# Patient Record
Sex: Male | Born: 1970 | Race: White | Hispanic: No | Marital: Married | Smoking: Current every day smoker
Health system: Southern US, Community
[De-identification: ages and names within clinical notes are randomized; demographics above are authoritative.]

## PROBLEM LIST (undated history)

## (undated) DIAGNOSIS — F17200 Nicotine dependence, unspecified, uncomplicated: Secondary | ICD-10-CM

## (undated) DIAGNOSIS — E785 Hyperlipidemia, unspecified: Secondary | ICD-10-CM

## (undated) DIAGNOSIS — M5386 Other specified dorsopathies, lumbar region: Secondary | ICD-10-CM

## (undated) DIAGNOSIS — F329 Major depressive disorder, single episode, unspecified: Secondary | ICD-10-CM

## (undated) DIAGNOSIS — K219 Gastro-esophageal reflux disease without esophagitis: Secondary | ICD-10-CM

## (undated) DIAGNOSIS — F419 Anxiety disorder, unspecified: Secondary | ICD-10-CM

## (undated) DIAGNOSIS — G709 Myoneural disorder, unspecified: Secondary | ICD-10-CM

## (undated) DIAGNOSIS — F32A Depression, unspecified: Secondary | ICD-10-CM

## (undated) HISTORY — DX: Nicotine dependence, unspecified, uncomplicated: F17.200

## (undated) HISTORY — PX: WISDOM TOOTH EXTRACTION: SHX21

## (undated) HISTORY — DX: Gastro-esophageal reflux disease without esophagitis: K21.9

## (undated) HISTORY — DX: Other specified dorsopathies, lumbar region: M53.86

## (undated) HISTORY — DX: Hyperlipidemia, unspecified: E78.5

---

## 2000-12-22 ENCOUNTER — Emergency Department (HOSPITAL_COMMUNITY): Admission: EM | Admit: 2000-12-22 | Discharge: 2000-12-22 | Payer: Self-pay

## 2002-01-19 ENCOUNTER — Emergency Department (HOSPITAL_COMMUNITY): Admission: EM | Admit: 2002-01-19 | Discharge: 2002-01-19 | Payer: Self-pay | Admitting: *Deleted

## 2006-05-22 ENCOUNTER — Emergency Department (HOSPITAL_COMMUNITY): Admission: EM | Admit: 2006-05-22 | Discharge: 2006-05-22 | Payer: Self-pay | Admitting: Emergency Medicine

## 2012-08-21 ENCOUNTER — Encounter (HOSPITAL_COMMUNITY): Payer: Self-pay | Admitting: Emergency Medicine

## 2012-08-21 ENCOUNTER — Emergency Department (HOSPITAL_COMMUNITY)
Admission: EM | Admit: 2012-08-21 | Discharge: 2012-08-21 | Disposition: A | Discharge status: Home / Self Care | Payer: Medicaid Other | Attending: Emergency Medicine | Admitting: Emergency Medicine

## 2012-08-21 DIAGNOSIS — M545 Low back pain, unspecified: Secondary | ICD-10-CM | POA: Insufficient documentation

## 2012-08-21 DIAGNOSIS — M5432 Sciatica, left side: Secondary | ICD-10-CM

## 2012-08-21 DIAGNOSIS — M79605 Pain in left leg: Secondary | ICD-10-CM

## 2012-08-21 DIAGNOSIS — M79609 Pain in unspecified limb: Secondary | ICD-10-CM | POA: Insufficient documentation

## 2012-08-21 DIAGNOSIS — M5430 Sciatica, unspecified side: Secondary | ICD-10-CM | POA: Insufficient documentation

## 2012-08-21 MED ORDER — PREDNISONE 20 MG PO TABS
60.0000 mg | ORAL_TABLET | Freq: Once | ORAL | Status: AC
  Administered 2012-08-21: 60 mg via ORAL
  Filled 2012-08-21: qty 3

## 2012-08-21 MED ORDER — OXYCODONE-ACETAMINOPHEN 5-325 MG PO TABS
2.0000 | ORAL_TABLET | Freq: Once | ORAL | Status: AC
  Administered 2012-08-21: 2 via ORAL
  Filled 2012-08-21 (×2): qty 1

## 2012-08-21 MED ORDER — PREDNISONE 10 MG PO TABS: 10.0000 mg | ORAL_TABLET | Freq: Every day | ORAL | Status: DC

## 2012-08-21 MED ORDER — HYDROCODONE-ACETAMINOPHEN 5-325 MG PO TABS: 1.0000 | ORAL_TABLET | Freq: Four times a day (QID) | ORAL | Status: DC | PRN

## 2012-08-21 MED ORDER — ONDANSETRON 4 MG PO TBDP
4.0000 mg | ORAL_TABLET | Freq: Once | ORAL | Status: AC
  Administered 2012-08-21: 4 mg via ORAL
  Filled 2012-08-21: qty 1

## 2012-08-21 MED ORDER — DIAZEPAM 5 MG PO TABS: 5.0000 mg | ORAL_TABLET | Freq: Two times a day (BID) | ORAL | Status: DC | PRN

## 2012-08-21 NOTE — ED Notes (Signed)
Lt leg pain for approx 4 days now and has lower back pain, unknown of any injury. Hurts to bend and sit down.

## 2012-08-21 NOTE — ED Notes (Signed)
MD at bedside. 

## 2012-08-21 NOTE — ED Provider Notes (Signed)
History     CSN: [id number]  Arrival date & time 08/21/12  0841   First MD Initiated Contact with Patient 08/21/12 0901      Chief Complaint  Patient presents with  . Leg Pain  . Back Pain    (Consider location/radiation/quality/duration/timing/severity/associated sxs/prior treatment) HPI  She presents to the emergency department with complaints of left lumbar back pain that radiates down the side of his leg all the way to his ankle. He says that he feels spasming in his low back his quad and his cath. New onset last week and he does not remember an injury to cause this. It hurts to bend and to sit. He is able to walk but slowly and with a slight limp. Does not have loss of bowel or bladder function, no history of IV drug use, no numbness, tingling or weakness. NAD vss      History reviewed. No pertinent past medical history.  History reviewed. No pertinent past surgical history.  No family history on file.  History  Substance Use Topics  . Smoking status: Not on file  . Smokeless tobacco: Not on file  . Alcohol Use: Not on file      Review of Systems  All other systems reviewed and are negative.    Allergies  Review of patient's allergies indicates not on file.  Home Medications  No current outpatient prescriptions on file.  BP 152/93  Pulse 81  Temp(Src) 97.6 F (36.4 C) (Oral)  Resp 16  SpO2 97%  Physical Exam  Nursing note and vitals reviewed. Constitutional: He appears well-developed and well-nourished. No distress.  HENT:  Head: Normocephalic and atraumatic.  Eyes: Pupils are equal, round, and reactive to light.  Neck: Normal range of motion. Neck supple.  Cardiovascular: Normal rate and regular rhythm.   Pulmonary/Chest: Effort normal.  Abdominal: Soft.  Musculoskeletal:       Back:   Equal strength to bilateral lower extremities. Neurosensory  function adequate to both legs. Skin color is normal. Skin is warm and moist. I see no step off  deformity, no bony tenderness. Pt is able to ambulate with limp due to pain. Pain is relieved when sitting in certain positions. ROM is decreased due to pain. No crepitus, laceration, effusion, swelling.  Pulses are normal   Neurological: He is alert.  Skin: Skin is warm and dry.    ED Course  Procedures (including critical care time)  Current facility-administered medications:ondansetron (ZOFRAN-ODT) disintegrating tablet 4 mg, 4 mg, Oral, Once, Cressida Milford G Kmya Placide, PA-C;  oxyCODONE-acetaminophen (PERCOCET/ROXICET) 5-325 MG per tablet 2 tablet, 2 tablet, Oral, Once, Laryn Venning G Arlen Legendre, PA-C;  predniSONE (DELTASONE) tablet 60 mg, 60 mg, Oral, Once, Dorthula Matas, PA-C Current outpatient prescriptions:diazepam (VALIUM) 5 MG tablet, Take 1 tablet (5 mg total) by mouth every 12 (twelve) hours as needed for anxiety., Disp: 12 tablet, Rfl: 0;  HYDROcodone-acetaminophen (NORCO/VICODIN) 5-325 MG per tablet, Take 1-2 tablets by mouth every 6 (six) hours as needed for pain., Disp: 12 tablet, Rfl: 0;  predniSONE (DELTASONE) 10 MG tablet, Take 1 tablet (10 mg total) by mouth daily., Disp: 21 tablet, Rfl: 0  Labs Reviewed - No data to display No results found.   1. Sciatica neuralgia, left       MDM  Patient with back pain. No neurological deficits. Patient is ambulatory. No warning symptoms of back pain including: loss of bowel or bladder control, night sweats, waking from sleep with back pain, unexplained fevers or weight loss,  h/o cancer, IVDU, recent trauma. No concern for cauda equina, epidural abscess, or other serious cause of back pain. Conservative measures such as rest, ice/heat and pain medicine and muscle relaxers indicated with PCP follow-up if no improvement with conservative management.           Dorthula Matas, PA-C 08/21/12 [phone]

## 2012-08-23 NOTE — ED Provider Notes (Signed)
Medical screening examination/treatment/procedure(s) were performed by non-physician practitioner and as supervising physician I was immediately available for consultation/collaboration.   Eleanor Dimichele E Emmarose Klinke, MD 08/23/12 2150 

## 2012-08-27 ENCOUNTER — Encounter (HOSPITAL_COMMUNITY): Payer: Self-pay

## 2012-08-27 ENCOUNTER — Emergency Department (HOSPITAL_COMMUNITY)
Admission: EM | Admit: 2012-08-27 | Discharge: 2012-08-27 | Disposition: A | Discharge status: Home / Self Care | Payer: Medicaid Other | Attending: Emergency Medicine | Admitting: Emergency Medicine

## 2012-08-27 DIAGNOSIS — M5432 Sciatica, left side: Secondary | ICD-10-CM

## 2012-08-27 DIAGNOSIS — M5430 Sciatica, unspecified side: Secondary | ICD-10-CM | POA: Insufficient documentation

## 2012-08-27 DIAGNOSIS — F17200 Nicotine dependence, unspecified, uncomplicated: Secondary | ICD-10-CM | POA: Insufficient documentation

## 2012-08-27 DIAGNOSIS — R269 Unspecified abnormalities of gait and mobility: Secondary | ICD-10-CM | POA: Insufficient documentation

## 2012-08-27 MED ORDER — ONDANSETRON 8 MG PO TBDP
8.0000 mg | ORAL_TABLET | Freq: Once | ORAL | Status: AC
  Administered 2012-08-27: 8 mg via ORAL
  Filled 2012-08-27: qty 1

## 2012-08-27 MED ORDER — HYDROCODONE-ACETAMINOPHEN 5-325 MG PO TABS: 1.0000 | ORAL_TABLET | Freq: Four times a day (QID) | ORAL | Status: DC | PRN

## 2012-08-27 MED ORDER — METHOCARBAMOL 500 MG PO TABS
500.0000 mg | ORAL_TABLET | Freq: Once | ORAL | Status: AC
  Administered 2012-08-27: 500 mg via ORAL
  Filled 2012-08-27: qty 1

## 2012-08-27 MED ORDER — METHOCARBAMOL 500 MG PO TABS: 500.0000 mg | ORAL_TABLET | Freq: Two times a day (BID) | ORAL | Status: DC | PRN

## 2012-08-27 MED ORDER — OXYCODONE-ACETAMINOPHEN 5-325 MG PO TABS
2.0000 | ORAL_TABLET | Freq: Once | ORAL | Status: AC
  Administered 2012-08-27: 2 via ORAL
  Filled 2012-08-27: qty 2

## 2012-08-27 NOTE — ED Notes (Signed)
Patient reports that he was seen for his back pain 6 days ago and was told to come back to the ED if the pain was no better. Patient states the pain is about the same and the meds that were given have not helped the pain.

## 2012-08-27 NOTE — ED Provider Notes (Signed)
History     CSN: [id number]  Arrival date & time 08/27/12  1330   First MD Initiated Contact with Patient 08/27/12 1400      Chief Complaint  Patient presents with  . reevaluation of back pain     (Consider location/radiation/quality/duration/timing/severity/associated sxs/prior treatment) HPI Comments: This is a 42 year old male who presents for reevaluation of his back pain. He was treated with a muscle relaxer, pain medication, and a steroid 1 week ago. He has had no improvement of his symptoms. He states the pain is along his low back with radiation down his right leg. It is sharp and burning. He was diagnosed with sciatica at his last visit. The pain has not changed since then. No fever, chills, vomiting, abdominal pain, dysuria, bowel or bladder incontinence. No history of CA.   The history is provided by the patient. No language interpreter was used.    History reviewed. No pertinent past medical history.  History reviewed. No pertinent past surgical history.  Family History  Problem Relation Age of Onset  . Cancer Sister     History  Substance Use Topics  . Smoking status: Current Every Day Smoker -- 1.00 packs/day    Types: Cigarettes  . Smokeless tobacco: Never Used  . Alcohol Use: Not on file     Comment: socially      Review of Systems  Constitutional: Negative for fever and chills.  Respiratory: Negative for shortness of breath.   Cardiovascular: Negative for chest pain.  Gastrointestinal: Negative for nausea, vomiting, abdominal pain and diarrhea.  Musculoskeletal: Positive for back pain.  All other systems reviewed and are negative.    Allergies  Review of patient's allergies indicates no known allergies.  Home Medications   Current Outpatient Rx  Name  Route  Sig  Dispense  Refill  . diazepam (VALIUM) 5 MG tablet   Oral   Take 1 tablet (5 mg total) by mouth every 12 (twelve) hours as needed for anxiety.   12 tablet   0   .  HYDROcodone-acetaminophen (NORCO/VICODIN) 5-325 MG per tablet   Oral   Take 1-2 tablets by mouth every 6 (six) hours as needed for pain.   12 tablet   0   . predniSONE (DELTASONE) 10 MG tablet   Oral   Take 1 tablet (10 mg total) by mouth daily.   21 tablet   0     6 tabs on day 1, 5 tabs on day 2, 4 tabs on day 3, ...     BP 169/80  Pulse 99  Temp(Src) 98 F (36.7 C) (Oral)  Resp 24  SpO2 100%  Physical Exam  Nursing note and vitals reviewed. Constitutional: He is oriented to person, place, and time. He appears well-developed and well-nourished. No distress.  HENT:  Head: Normocephalic and atraumatic.  Right Ear: External ear normal.  Left Ear: External ear normal.  Nose: Nose normal.  Eyes: Conjunctivae are normal.  Neck: Normal range of motion. No tracheal deviation present.  Cardiovascular: Normal rate, regular rhythm and normal heart sounds.   Pulmonary/Chest: Effort normal and breath sounds normal. No stridor.  Abdominal: Soft. He exhibits no distension. There is no tenderness.  Musculoskeletal: Normal range of motion.       Lumbar back: He exhibits tenderness and pain.  ttp over sciatic notch No step offs, deformity, or bony tenderness  Neurological: He is alert and oriented to person, place, and time. Gait (antalgic) abnormal.  Skin: Skin is  warm and dry. He is not diaphoretic.  Psychiatric: He has a normal mood and affect. His behavior is normal.    ED Course  Procedures (including critical care time)  Labs Reviewed - No data to display No results found.   1. Sciatica neuralgia, left       MDM  Patient presents for follow up of low back pain. Symptoms unchanged after 1 week of conservative management. Given a referral to ortho for better management of pain. No concern for cauda equina or spinal abscess. Symptoms consistent with sciatica. Return precautions given. Vital signs stable for discharge. Patient / Family / Caregiver informed of clinical  course, understand medical decision-making process, and agree with plan.         Mora Bellman, PA-C 08/29/12 1017

## 2012-08-30 NOTE — ED Provider Notes (Signed)
Medical screening examination/treatment/procedure(s) were performed by non-physician practitioner and as supervising physician I was immediately available for consultation/collaboration.    Eryc Bodey R Ala Kratz, MD 08/30/12 0423 

## 2012-09-13 ENCOUNTER — Encounter (HOSPITAL_COMMUNITY): Payer: Self-pay | Admitting: *Deleted

## 2012-09-13 ENCOUNTER — Emergency Department (INDEPENDENT_AMBULATORY_CARE_PROVIDER_SITE_OTHER): Payer: Medicaid Other

## 2012-09-13 ENCOUNTER — Emergency Department (HOSPITAL_COMMUNITY)
Admission: EM | Admit: 2012-09-13 | Discharge: 2012-09-13 | Disposition: A | Discharge status: Home / Self Care | Payer: Medicaid Other | Source: Home / Self Care | Attending: Emergency Medicine | Admitting: Emergency Medicine

## 2012-09-13 DIAGNOSIS — M5430 Sciatica, unspecified side: Secondary | ICD-10-CM

## 2012-09-13 DIAGNOSIS — M5432 Sciatica, left side: Secondary | ICD-10-CM

## 2012-09-13 MED ORDER — KETOROLAC TROMETHAMINE 60 MG/2ML IM SOLN
60.0000 mg | Freq: Once | INTRAMUSCULAR | Status: AC
  Administered 2012-09-13: 60 mg via INTRAMUSCULAR

## 2012-09-13 MED ORDER — METHYLPREDNISOLONE ACETATE 40 MG/ML IJ SUSP
80.0000 mg | Freq: Once | INTRAMUSCULAR | Status: AC
  Administered 2012-09-13: 80 mg via INTRAMUSCULAR

## 2012-09-13 MED ORDER — METHYLPREDNISOLONE ACETATE 80 MG/ML IJ SUSP
INTRAMUSCULAR | Status: AC
  Filled 2012-09-13: qty 1

## 2012-09-13 MED ORDER — KETOROLAC TROMETHAMINE 60 MG/2ML IM SOLN
INTRAMUSCULAR | Status: AC
  Filled 2012-09-13: qty 2

## 2012-09-13 MED ORDER — OXYCODONE-ACETAMINOPHEN 5-325 MG PO TABS: 1.0000 | ORAL_TABLET | ORAL | Status: DC | PRN

## 2012-09-13 MED ORDER — TRIAMCINOLONE ACETONIDE 40 MG/ML IJ SUSP (RADIOLOGY): 40.0000 mg | Freq: Once | INTRAMUSCULAR | Status: DC

## 2012-09-13 MED ORDER — PREDNISONE 10 MG PO TABS: 20.0000 mg | ORAL_TABLET | Freq: Every day | ORAL | Status: DC

## 2012-09-13 NOTE — ED Provider Notes (Signed)
History     CSN: [id number]  Arrival date & time 09/13/12  1902   First MD Initiated Contact with Patient 09/13/12 2012      Chief Complaint  Patient presents with  . Back Pain    (Consider location/radiation/quality/duration/timing/severity/associated sxs/prior treatment) Patient is a 42 y.o. male presenting with back pain. The history is provided by the patient.  Back Pain Location:  Lumbar spine Quality:  Stabbing Radiates to:  L posterior upper leg Pain severity:  Moderate Pain is:  Same all the time Onset quality:  Gradual Timing:  Constant Progression:  Unchanged Chronicity:  New Context: not falling and not recent injury   Relieved by:  Nothing Worsened by:  Movement Ineffective treatments:  NSAIDs and OTC medications Associated symptoms: leg pain and tingling   Associated symptoms: no bladder incontinence, no bowel incontinence, no fever, no perianal numbness and no weakness     History reviewed. No pertinent past medical history.  History reviewed. No pertinent past surgical history.  Family History  Problem Relation Age of Onset  . Cancer Sister     History  Substance Use Topics  . Smoking status: Current Every Day Smoker -- 1.00 packs/day    Types: Cigarettes  . Smokeless tobacco: Never Used  . Alcohol Use: Not on file     Comment: socially      Review of Systems  Constitutional: Negative.  Negative for fever.  Gastrointestinal: Negative for bowel incontinence.  Genitourinary: Negative for bladder incontinence.  Musculoskeletal: Positive for back pain.  Neurological: Positive for tingling. Negative for weakness.  All other systems reviewed and are negative.    Allergies  Review of patient's allergies indicates no known allergies.  Home Medications   Current Outpatient Rx  Name  Route  Sig  Dispense  Refill  . diazepam (VALIUM) 5 MG tablet   Oral   Take 1 tablet (5 mg total) by mouth every 12 (twelve) hours as needed for anxiety.  12 tablet   0   . HYDROcodone-acetaminophen (NORCO) 5-325 MG per tablet   Oral   Take 1 tablet by mouth every 6 (six) hours as needed for pain.   6 tablet   0   . HYDROcodone-acetaminophen (NORCO/VICODIN) 5-325 MG per tablet   Oral   Take 1-2 tablets by mouth every 6 (six) hours as needed for pain.   12 tablet   0   . methocarbamol (ROBAXIN) 500 MG tablet   Oral   Take 1 tablet (500 mg total) by mouth 2 (two) times daily as needed.   20 tablet   0   . oxyCODONE-acetaminophen (PERCOCET/ROXICET) 5-325 MG per tablet   Oral   Take 1 tablet by mouth every 4 (four) hours as needed for pain.   20 tablet   0   . predniSONE (DELTASONE) 10 MG tablet   Oral   Take 1 tablet (10 mg total) by mouth daily.   21 tablet   0     6 tabs on day 1, 5 tabs on day 2, 4 tabs on day 3, ...   . predniSONE (DELTASONE) 10 MG tablet   Oral   Take 2 tablets (20 mg total) by mouth daily.   15 tablet   0     BP 160/81  Pulse 85  Temp(Src) 98 F (36.7 C) (Oral)  Resp 16  SpO2 98%  Physical Exam  Vitals reviewed. Constitutional: He is oriented to person, place, and time. He appears well-developed and well-nourished.  HENT:  Head: Normocephalic and atraumatic.  Eyes: Pupils are equal, round, and reactive to light.  Neck: Normal range of motion. Neck supple.  Cardiovascular: Normal rate and regular rhythm.   Pulmonary/Chest: Effort normal and breath sounds normal.  Abdominal: Soft. Bowel sounds are normal.  Musculoskeletal:  LOW BACK SHOWED NO SWELLING OR DEFORMITY NO ERYTHEMA OR TENDERNESS ALONG SPINE MILD PARALUMBAR MUSCLE TENEDERNESS   Neurological: He is alert and oriented to person, place, and time. He has normal reflexes. He exhibits normal muscle tone.  Skin: Skin is warm and dry.  Psychiatric: He has a normal mood and affect. His behavior is normal. Judgment and thought content normal.    ED Course  Procedures (including critical care time)  Labs Reviewed - No data to  display Dg Lumbar Spine Complete  09/13/2012  *RADIOLOGY REPORT*  Clinical Data: Lower back pain on the left side for 3 weeks.  Pain radiating down the left leg.  LUMBAR SPINE - COMPLETE 4+ VIEW  Comparison: None.  Findings: Five lumbar type vertebral bodies are present.  Vertebral body height is preserved.  The L4-L5 disc space narrowing is present.  Grade 1 retrolisthesis of L4 on L5 is present associated with disc space collapse.  This measures about 4 mm.  There is no fracture.  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 Findings suggest L4-L5 disc herniation which could be further evaluated on nonemergent MRI.   Original Report Authenticated By: Andreas Newport, M.D.      1. Sciatica neuralgia, left       MDM          Jani Files, MD 09/13/12 2104

## 2012-09-13 NOTE — ED Notes (Signed)
Pt  Reports  Low  Back  Pain  With  Pain  Down l  Leg      Symptoms  X  2  Weeks       denys  Any injury   Pain is  Worse  When he  Sits  Down         denys  Any  Urinary  Symptoms       Symptoms  Not  releived  By otc  meds          Walks  With slow  Gait  Appears  In pain

## 2012-09-18 ENCOUNTER — Ambulatory Visit: Payer: Self-pay | Admitting: Family Medicine

## 2014-03-06 ENCOUNTER — Encounter: Payer: Self-pay | Admitting: Physician Assistant

## 2014-03-06 ENCOUNTER — Ambulatory Visit (INDEPENDENT_AMBULATORY_CARE_PROVIDER_SITE_OTHER): Payer: Medicaid Other | Admitting: Physician Assistant

## 2014-03-06 VITALS — BP 142/82 | HR 76 | Temp 98.1°F | Resp 20 | Ht 71.5 in | Wt 236.0 lb

## 2014-03-06 DIAGNOSIS — Z23 Encounter for immunization: Secondary | ICD-10-CM

## 2014-03-06 DIAGNOSIS — Z720 Tobacco use: Secondary | ICD-10-CM | POA: Diagnosis not present

## 2014-03-06 DIAGNOSIS — F17200 Nicotine dependence, unspecified, uncomplicated: Secondary | ICD-10-CM

## 2014-03-06 DIAGNOSIS — Z801 Family history of malignant neoplasm of trachea, bronchus and lung: Secondary | ICD-10-CM | POA: Diagnosis not present

## 2014-03-06 DIAGNOSIS — Z0000 Encounter for general adult medical examination without abnormal findings: Secondary | ICD-10-CM | POA: Diagnosis not present

## 2014-03-06 DIAGNOSIS — M539 Dorsopathy, unspecified: Secondary | ICD-10-CM | POA: Diagnosis not present

## 2014-03-06 DIAGNOSIS — M5386 Other specified dorsopathies, lumbar region: Secondary | ICD-10-CM

## 2014-03-06 LAB — COMPLETE METABOLIC PANEL WITH GFR
ALBUMIN: 4.5 g/dL (ref 3.5–5.2)
ALT: 28 U/L (ref 0–53)
AST: 24 U/L (ref 0–37)
Alkaline Phosphatase: 93 U/L (ref 39–117)
BUN: 9 mg/dL (ref 6–23)
CALCIUM: 9.4 mg/dL (ref 8.4–10.5)
CHLORIDE: 105 meq/L (ref 96–112)
CO2: 26 meq/L (ref 19–32)
CREATININE: 1.11 mg/dL (ref 0.50–1.35)
GFR, Est Non African American: 80 mL/min
Glucose, Bld: 83 mg/dL (ref 70–99)
Potassium: 4.9 mEq/L (ref 3.5–5.3)
Sodium: 138 mEq/L (ref 135–145)
Total Bilirubin: 0.6 mg/dL (ref 0.2–1.2)
Total Protein: 7.4 g/dL (ref 6.0–8.3)

## 2014-03-06 LAB — CBC WITH DIFFERENTIAL/PLATELET
Basophils Absolute: 0.1 10*3/uL (ref 0.0–0.1)
Basophils Relative: 1 % (ref 0–1)
EOS PCT: 2 % (ref 0–5)
Eosinophils Absolute: 0.1 10*3/uL (ref 0.0–0.7)
HEMATOCRIT: 46.4 % (ref 39.0–52.0)
HEMOGLOBIN: 16.4 g/dL (ref 13.0–17.0)
LYMPHS ABS: 2 10*3/uL (ref 0.7–4.0)
LYMPHS PCT: 32 % (ref 12–46)
MCH: 32.6 pg (ref 26.0–34.0)
MCHC: 35.3 g/dL (ref 30.0–36.0)
MCV: 92.2 fL (ref 78.0–100.0)
MONO ABS: 0.4 10*3/uL (ref 0.1–1.0)
MONOS PCT: 7 % (ref 3–12)
Neutro Abs: 3.7 10*3/uL (ref 1.7–7.7)
Neutrophils Relative %: 58 % (ref 43–77)
Platelets: 231 10*3/uL (ref 150–400)
RBC: 5.03 MIL/uL (ref 4.22–5.81)
RDW: 13.4 % (ref 11.5–15.5)
WBC: 6.3 10*3/uL (ref 4.0–10.5)

## 2014-03-06 LAB — LIPID PANEL
CHOLESTEROL: 216 mg/dL — AB (ref 0–200)
HDL: 35 mg/dL — ABNORMAL LOW (ref >39)
LDL CALC: 124 mg/dL — AB (ref 0–99)
TRIGLYCERIDES: 283 mg/dL — AB (ref <150)
Total CHOL/HDL Ratio: 6.2 Ratio
VLDL: 57 mg/dL — ABNORMAL HIGH (ref 0–40)

## 2014-03-06 LAB — TSH: TSH: 1.547 u[IU]/mL (ref 0.350–4.500)

## 2014-03-06 MED ORDER — BUPROPION HCL ER (SR) 150 MG PO TB12: ORAL_TABLET | ORAL | Status: DC

## 2014-03-06 NOTE — Progress Notes (Signed)
Patient ID: Rick Kim MRN: [id number], DOB: 1970/07/09 43 y.o. Date of Encounter: 03/06/2014, 12:48 PM    Chief Complaint: Physical (CPE)  HPI: 43 y.o. y/o male here for CPE.   His last office visit here was 07/20/2012. At that time he had a complete physical exam with Dr. Dennard Schaumann. He says that prior to that he come here off and on for many many years.  He has no specific complaints or concerns today.   Review of Systems: Consitutional: No fever, chills, fatigue, night sweats, lymphadenopathy, or weight changes. Eyes: No visual changes, eye redness, or discharge. ENT/Mouth: Ears: No otalgia, tinnitus, hearing loss, discharge. Nose: No congestion, rhinorrhea, sinus pain, or epistaxis. Throat: No sore throat, post nasal drip, or teeth pain. Cardiovascular: No CP, palpitations, diaphoresis, DOE, edema, orthopnea, PND. Respiratory: No cough, hemoptysis, SOB, or wheezing. Gastrointestinal: No anorexia, dysphagia, reflux, pain, nausea, vomiting, hematemesis, diarrhea, constipation, BRBPR, or melena. Genitourinary: No dysuria, frequency, urgency, hematuria, incontinence, nocturia, decreased urinary stream, discharge, impotence, or testicular pain/masses. Musculoskeletal: No decreased ROM, myalgias, stiffness, joint swelling, or weakness. Skin: No rash, erythema, lesion changes, pain, warmth, jaundice, or pruritis. Neurological: No headache, dizziness, syncope, seizures, tremors, memory loss, coordination problems, or paresthesias. Psychological: No anxiety, depression, hallucinations, SI/HI. Endocrine: No fatigue, polydipsia, polyphagia, polyuria, or known diabetes. All other systems were reviewed and are otherwise negative.  Past Medical History  Diagnosis Date  . Smoker   . Disorder of lumbar spine   He says  that he has had problems with discs in his lumbar spine. Says he has had injections done to the area --at [facility]-- in the past.  History reviewed. No  pertinent past surgical history.  Home Meds:  None  Allergies: No Known Allergies  History   Social History  . Marital Status: Divorced    Spouse Name: N/A    Number of Children: N/A  . Years of Education: N/A   Occupational History  . Not on file.   Social History Main Topics  . Smoking status: Current Every Day Smoker -- 1.00 packs/day    Types: Cigarettes  . Smokeless tobacco: Never Used  . Alcohol Use: Not on file     Comment: socially  . Drug Use: No  . Sexual Activity: Not on file   Other Topics Concern  . Not on file   Social History Narrative   Entered 2015:   Married.   4 kids -- Ages 15 y/o, 47 y/o, Twins 35 y/o   2 older kids from different marriage.    Twins from new marriage --this wife had never had kids until these twins.   Works on [employer].   He walks around neighborhood routinely and rides bike.          Family History  Problem Relation Age of Onset  . Cancer Sister 30    Lung Cancer  . Alcohol abuse Father     Physical Exam: Blood pressure 142/82, pulse 76, temperature 98.1 F (36.7 C), temperature source Oral, resp. rate 20, height 5' 11.5" (1.816 m), weight 236 lb (107.049 kg).  General: Well developed, well nourished, WM. Appears in no acute distress. HEENT: Normocephalic, atraumatic. Conjunctiva pink, sclera non-icteric. Pupils 2 mm constricting to 1 mm, round, regular, and equally reactive to light and accomodation. EOMI. Internal auditory canal clear. TMs with good cone of light and without pathology. Nasal mucosa pink. Nares are without discharge. No sinus tenderness. Oral mucosa pink. Dentition--He has upper Dentures in. Pharynx  without exudate.   Neck: Supple. Trachea midline. No thyromegaly. Full ROM. No lymphadenopathy. Lungs: Clear to auscultation bilaterally without wheezes, rales, or rhonchi. Breathing is of normal effort and unlabored. Cardiovascular: RRR with S1 S2. No murmurs, rubs, or gallops. Distal pulses 2+ symmetrically. No  carotid or abdominal bruits. Abdomen: Soft, non-tender, non-distended with normoactive bowel sounds. No hepatosplenomegaly or masses. No rebound/guarding. No CVA tenderness. No hernias.  Musculoskeletal: Full range of motion and 5/5 strength throughout. Without swelling, atrophy, tenderness, crepitus, or warmth. Extremities without clubbing, cyanosis, or edema. Calves supple. Skin: He has lots of tattoos-- on arms, chest, back   Warm and moist without erythema, ecchymosis, wounds, or rash. Neuro: A+Ox3. CN II-XII grossly intact. Moves all extremities spontaneously. Full sensation throughout. Normal gait. DTR 2+ throughout upper and lower extremities. Finger to nose intact. Psych:  Responds to questions appropriately with a normal affect.   Assessment/Plan:  43 y.o. y/o white male here for CPE  -1. Visit for preventive health examination  A. Screening Labs:  - CBC with Differential - COMPLETE METABOLIC PANEL WITH GFR - Lipid panel - TSH  B. Screening For Prostate Cancer:  Not indicated until age 58  C. Screening For Colorectal Cancer:   Not indicated until age 34  D. Immunizations: Flu--- patient is agreeable to receive influenza vaccine here today. Tetanus--he reports he has had no tetanus vaccine in the past 10 year's. He is agreeable to receive this today. Pneumococcal--- given that he is a smoker he does need pneumonia vaccine.  WILL NEED TO GIVE THIS AT HIS NEXT OV Zostavax----not indicated until age 61.   - DG Chest 2 View; Future  2. Smoker Given that he is a smoker and his sister had lung cancer Will obtain chest x-ray. He states that his sister who had lung cancer actually did not even smoke. He is agreeable to obtain x-ray. - DG Chest 2 View; Future  At his office visit here 07/2012 Chantix was prescribed for smoking cessation. Patient reports that he took the medication for about 2 weeks but then it made him feel weird and actually even scared him so he quit taking it.  However he says that he does want to quit smoking and would like to use any other treatment to help him with this. Today I have given him a handout with Tips for Cessation.  Will also prescribe Wellbutrin. If he develops any adverse effects then he needs to stop the medication immediately and notify us. - buPROPion (WELLBUTRIN SR) 150 MG 12 hr tablet; Take one daily for 5 days then increase to twice a day  Dispense: 60 tablet; Refill: 4  3. 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 Family history of lung cancer - DG Chest 2 View; Future  5. Multiple Tattoos  5. Need for prophylactic vaccination and inoculation against influenza - Flu Vaccine QUAD 36+ mos PF IM (Fluarix Quad PF)  6. Need for prophylactic vaccination with combined diphtheria-tetanus-pertussis (DTP) vaccine - Tdap vaccine greater than or equal to 7yo IM   Signed:   [address] [place], [place]  03/06/2014 12:48 PM

## 2014-05-17 HISTORY — PX: MULTIPLE TOOTH EXTRACTIONS: SHX2053

## 2016-04-22 ENCOUNTER — Ambulatory Visit
Admission: RE | Admit: 2016-04-22 | Discharge: 2016-04-22 | Disposition: A | Discharge status: Home / Self Care | Payer: Medicaid Other | Source: Ambulatory Visit | Attending: Physician Assistant | Admitting: Physician Assistant

## 2016-04-22 ENCOUNTER — Encounter: Payer: Self-pay | Admitting: Physician Assistant

## 2016-04-22 ENCOUNTER — Ambulatory Visit (INDEPENDENT_AMBULATORY_CARE_PROVIDER_SITE_OTHER): Payer: Medicaid Other | Admitting: Physician Assistant

## 2016-04-22 VITALS — BP 150/92 | HR 74 | Temp 97.4°F | Resp 18 | Wt 249.0 lb

## 2016-04-22 DIAGNOSIS — R202 Paresthesia of skin: Secondary | ICD-10-CM | POA: Diagnosis not present

## 2016-04-22 DIAGNOSIS — F17200 Nicotine dependence, unspecified, uncomplicated: Secondary | ICD-10-CM

## 2016-04-22 DIAGNOSIS — G4700 Insomnia, unspecified: Secondary | ICD-10-CM | POA: Diagnosis not present

## 2016-04-22 DIAGNOSIS — Z801 Family history of malignant neoplasm of trachea, bronchus and lung: Secondary | ICD-10-CM | POA: Diagnosis not present

## 2016-04-22 DIAGNOSIS — Z0000 Encounter for general adult medical examination without abnormal findings: Secondary | ICD-10-CM

## 2016-04-22 DIAGNOSIS — M792 Neuralgia and neuritis, unspecified: Secondary | ICD-10-CM

## 2016-04-22 DIAGNOSIS — Z23 Encounter for immunization: Secondary | ICD-10-CM | POA: Diagnosis not present

## 2016-04-22 DIAGNOSIS — G5690 Unspecified mononeuropathy of unspecified upper limb: Secondary | ICD-10-CM

## 2016-04-22 LAB — CBC WITH DIFFERENTIAL/PLATELET
BASOS ABS: 57 {cells}/uL (ref 0–200)
Basophils Relative: 1 %
EOS ABS: 114 {cells}/uL (ref 15–500)
Eosinophils Relative: 2 %
HEMATOCRIT: 47.8 % (ref 38.5–50.0)
HEMOGLOBIN: 16.2 g/dL (ref 13.0–17.0)
LYMPHS ABS: 1938 {cells}/uL (ref 850–3900)
Lymphocytes Relative: 34 %
MCH: 31.3 pg (ref 27.0–33.0)
MCHC: 33.9 g/dL (ref 32.0–36.0)
MCV: 92.5 fL (ref 80.0–100.0)
MONO ABS: 456 {cells}/uL (ref 200–950)
MPV: 10.6 fL (ref 7.5–12.5)
Monocytes Relative: 8 %
NEUTROS ABS: 3135 {cells}/uL (ref 1500–7800)
Neutrophils Relative %: 55 %
Platelets: 250 10*3/uL (ref 140–400)
RBC: 5.17 MIL/uL (ref 4.20–5.80)
RDW: 12.9 % (ref 11.0–15.0)
WBC: 5.7 10*3/uL (ref 3.8–10.8)

## 2016-04-22 LAB — COMPLETE METABOLIC PANEL WITH GFR
ALBUMIN: 4.5 g/dL (ref 3.6–5.1)
ALK PHOS: 81 U/L (ref 40–115)
ALT: 26 U/L (ref 9–46)
AST: 21 U/L (ref 10–40)
BILIRUBIN TOTAL: 0.4 mg/dL (ref 0.2–1.2)
BUN: 11 mg/dL (ref 7–25)
CO2: 27 mmol/L (ref 20–31)
CREATININE: 1.26 mg/dL (ref 0.60–1.35)
Calcium: 9.7 mg/dL (ref 8.6–10.3)
Chloride: 103 mmol/L (ref 98–110)
GFR, EST AFRICAN AMERICAN: 79 mL/min (ref >=60)
GFR, EST NON AFRICAN AMERICAN: 68 mL/min (ref >=60)
Glucose, Bld: 76 mg/dL (ref 70–99)
Potassium: 4.6 mmol/L (ref 3.5–5.3)
Sodium: 138 mmol/L (ref 135–146)
TOTAL PROTEIN: 7.2 g/dL (ref 6.1–8.1)

## 2016-04-22 LAB — TSH: TSH: 2.29 mIU/L (ref 0.40–4.50)

## 2016-04-22 MED ORDER — ZOLPIDEM TARTRATE 10 MG PO TABS: 10.0000 mg | ORAL_TABLET | Freq: Every evening | ORAL | 1 refills | Status: DC | PRN

## 2016-04-22 NOTE — Progress Notes (Signed)
Patient ID: Rick Kim MRN: [id number], DOB: [birth year] 45 y.o. Date of Encounter: 04/22/2016, 9:26 AM    Chief Complaint: Physical (CPE)  HPI: 45 y.o. y/o male here for CPE.   He had OV 07/2012 for complete physical exam with Dr. Dennard Schaumann. He had CPE with me 03/06/2014. He said that prior to that he come here off and on for many many years.  Today he does have a couple of issues to report/discuss.  States that he has been having problems with pain and paresthesias down his arms. He says that he feels it all the way down the entire arm. Says that he feels a throbbing discomfort and also a feeling as if they have "fallen asleep ". Says that if it happens during the daytime then he is able to shake out his arms and move them around. Says that at night he will wake up and they just feel sore. Says that sometimes when he is "gripping the steering wheel and also when he is drawing or painting --at these times his entire arms bilaterally will feel numb. Asked if he has felt any neck pain. 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 Has not been dropping any tools etc.  He also says that he is getting "no sleep"--says that his "mind won't shut off". So, he feels like he has absolutely no energy and feels exhausted. Says that he will get in bed but then just stares at the wall etc. and can't sleep. Says that recently he has just been falling asleep in the recliner watching TV. Even so he still doesn't fall asleep until around 1 or 2 in the morning. Then he says that if anything at all wakes him up, then he can't go back to sleep. "If the dog barks then it's all over."  Asked how often this happens but he says that usually will have to wake up to go to the bathroom or something and then he never can get back to sleep. He does not take any naps during the day.  Also discussed that at his physical 02/2014 I had ordered a chest x-ray but he did not have it done. Says he was unable to get a ride at that time but that he does want to get that chest x-ray. Noted that I had prescribed Wellbutrin for smoking cessation and he says he doesn't think he ever used it. Is currently smoking 1 pack per day.  No other complaints or concerns.   Review of Systems: Consitutional: No fever, chills, fatigue, night sweats, lymphadenopathy, or weight changes. Eyes: No visual changes, eye redness, or discharge. ENT/Mouth: Ears: No otalgia, tinnitus, hearing loss, discharge. Nose: No congestion, rhinorrhea, sinus pain, or epistaxis. Throat: No sore throat, post nasal drip, or teeth pain. Cardiovascular: No CP, palpitations, diaphoresis, DOE, edema, orthopnea, PND. Respiratory: No cough, hemoptysis, SOB, or wheezing. Gastrointestinal: No anorexia, dysphagia, reflux, pain, nausea, vomiting, hematemesis, diarrhea, constipation, BRBPR, or melena. Genitourinary: No dysuria, frequency, urgency, hematuria, incontinence, nocturia, decreased urinary stream, discharge, impotence, or testicular pain/masses. Musculoskeletal: No decreased ROM, myalgias, stiffness, joint  swelling, or weakness. Skin: No rash, erythema, lesion changes, pain, warmth, jaundice, or pruritis. Neurological: No headache, dizziness, syncope, seizures, tremors, memory loss, coordination problems, or paresthesias. Psychological: No anxiety, depression, hallucinations, SI/HI. Endocrine: No fatigue, polydipsia, polyphagia, polyuria, or known diabetes. All other systems were reviewed and are otherwise negative.  Past Medical History:  Diagnosis Date  . Disorder of lumbar spine   . 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 Says he has had injections done to the area --at [facility]-- in the past.  No past surgical history on file.  Home Meds:  None  Allergies: No Known Allergies  Social History   Social History  . Marital status: Divorced    Spouse name: N/A  . Number of children: N/A  . Years of education: N/A   Occupational History  . Not on file.   Social History Main Topics  . Smoking status: Current Every Day Smoker    Packs/day: 1.00    Types: Cigarettes  . Smokeless tobacco: Never Used  . Alcohol use Not on file     Comment: socially  . Drug use: No  . Sexual activity: Not on file   Other Topics Concern  . Not on file   Social History Narrative   Entered 2015:   Married.   4 kids -- Ages 23 y/o, 8 y/o, Twins 4 y/o   2 older kids from different marriage.    Twins from new marriage --this wife had never had kids until these twins.   Works on [employer].   He walks around neighborhood routinely and rides bike.          Family History  Problem Relation Age of Onset  . Cancer Sister 46    Lung Cancer  . Alcohol abuse Father     Physical Exam: Blood pressure (!) 150/92, pulse 74, temperature 97.4 F (36.3 C), temperature source Oral, resp. rate 18, weight 249 lb (112.9 kg), SpO2 97 %.  General: Well developed, well nourished, WM. Appears in no acute distress. HEENT: Normocephalic, atraumatic. Conjunctiva pink, sclera non-icteric. Pupils 2 mm constricting to 1 mm, round, regular, and equally reactive to light and accomodation. EOMI. Internal auditory canal clear. TMs with good cone of light and without pathology. Nasal mucosa pink. Nares are without discharge. No sinus tenderness. Oral mucosa pink. Dentition--He has upper Dentures in. Pharynx without exudate.   Neck: Supple. Trachea midline. No thyromegaly.  No lymphadenopathy. Lungs: Clear to auscultation bilaterally without wheezes, rales, or rhonchi. Breathing is of normal effort and  unlabored. Cardiovascular: RRR with S1 S2. No murmurs, rubs, or gallops. Distal pulses 2+ symmetrically. No carotid or abdominal bruits. Abdomen: Soft, non-tender, non-distended with normoactive bowel sounds. No hepatosplenomegaly or masses. No rebound/guarding. No CVA tenderness. No hernias.  Musculoskeletal: Full range of motion and 5/5 strength throughout. Without swelling, atrophy, tenderness, crepitus, or warmth.  He does have tenderness with palpation along both sides of his neck and down towards the scapula covering the entire area of the trapezius etc. He has pretty good range of motion of his neck. So had him perform range of motion of the shoulders and he has full range of motion but when he abducts does say that that causes pain up in the tops of the shoulders. He has strong 5/5 grip strength and upper extremity strength bilaterally. Skin: He has lots of tattoos-- on arms, chest, back   Warm and moist without erythema,  ecchymosis, wounds, or rash. Neuro: A+Ox3. CN II-XII grossly intact. Moves all extremities spontaneously. Full sensation throughout. Normal gait. DTR 2+ throughout upper and lower extremities.  Psych:  Responds to questions appropriately with a normal affect.   Assessment/Plan:  45 y.o. y/o white male here for CPE  -1. Visit for preventive health examination  A. Screening Labs: He is not fasting today. Reviewed his lipid panel from 02/2014 was okay so we can just skip rechecking lipid panel today and go ahead and check other labs while he is here. - CBC with Differential - COMPLETE METABOLIC PANEL WITH GFR - TSH  B. Screening For Prostate Cancer:  Not indicated until age 66  C. Screening For Colorectal Cancer:   Not indicated until age 57  D. Immunizations: Flu--- patient is agreeable to receive influenza vaccine here today. Tetanus-- Tdap--Given here 03/06/2014 Pneumococcal--- given that he is a smoker he does need pneumonia vaccine. --Pneumovax 23---Given  here 04/22/2016.   -- Further Pneumonia Vaccine not due until age 73 Zostavax----not indicated until age 37.    2. Smoker Given that he is a smoker and his sister had lung cancer Will obtain chest x-ray. He states that his sister who had lung cancer actually did not even smoke. He is agreeable to obtain x-ray. - DG Chest 2 View; Future  At his office visit here 07/2012 Chantix was prescribed for smoking cessation. Patient reports that he took the medication for about 2 weeks but then it made him feel weird and actually even scared him so he quit taking it. However he says that he does want to quit smoking and would like to use any other treatment to help him with this. At CPE 02/2014--- I have given him a handout with Tips for Cessation.  At CPE 02/2014--- I also prescribed Wellbutrin. However he never took this. 04/22/2016: Will rediscuss smoking cessation at follow-up visit.  3. Family history of lung cancer He states that his sister who had lung cancer actually did not even smoke.  - DG Chest 2 View; Future   Insomnia, unspecified type He mentions his "mind racing"---he may possibly have a component of generalized anxiety disorder--- but when I talked to him more about these symptoms-- during the daytime it doesn't seem to be that significant----ultimately he may need to be on an SSRI----- but I think first step we need to just get him to where he can get some adequate sleep and then see how he is feeling when he is not sleep deprived and exhausted. He states that he has never used any medicines for sleep --  has not used Ambien in the past. He is to take the Ambien about 30 minutes before planning to go to sleep and make sure he has at least 8 hours to sleep. If this causes any adverse effects, he is to call me.  Otherwise, will have him schedule follow-up visit in one week and will follow this up at that time. - zolpidem (AMBIEN) 10 MG tablet; Take 1 tablet (10 mg total) by mouth at bedtime  as needed for sleep.  Dispense: 15 tablet; Refill: 1  5. Paresthesia of arm Will obtain x-ray cervical spine and follow-up with him when I get this result. As well we'll schedule follow-up visit with me in one week. - DG Cervical Spine Complete; Future  6. Neuropathic pain, arm Will obtain x-ray cervical spine and follow-up with him when I get this result. As well we'll schedule follow-up visit with me  in one week. - DG Cervical Spine Complete; Future  7. Hypertension Blood pressure is reading high at today's visit. Will try to get his insomnia controlled and then recheck blood pressure once he is getting adequate sleep. He is to have follow-up office visit here in one week.  8. Multiple Tattoos  He is agreeable to return for follow-up office visit in one week.  Signed:   [address] [place], [place]  04/22/2016 9:26 AM

## 2016-04-22 NOTE — Addendum Note (Signed)
Addended by: Vonna Kotyk A on: 04/22/2016 04:54 PM   Modules accepted: Orders

## 2016-04-23 ENCOUNTER — Telehealth: Payer: Self-pay

## 2016-04-23 ENCOUNTER — Other Ambulatory Visit: Payer: Self-pay | Admitting: Family Medicine

## 2016-04-23 DIAGNOSIS — M5386 Other specified dorsopathies, lumbar region: Secondary | ICD-10-CM

## 2016-04-23 NOTE — Telephone Encounter (Signed)
Spoke with pt provided results he is req an Rx for Chantix if possible

## 2016-04-23 NOTE — Telephone Encounter (Signed)
-----   Message from Rick Sheldon, PA-C sent at 04/22/2016  5:17 PM EST ----- Patient also had x-ray cervical spine so discuss both results when you call him. 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 Chest x-ray shows no other abnormality.

## 2016-04-26 NOTE — Telephone Encounter (Signed)
He has one week follow up OV scheduled for 04/29/16. We'll discuss this at that office visit. He has tried Chantix in the past and had side effects so will wait to discuss further at [place].

## 2016-04-26 NOTE — Telephone Encounter (Signed)
Pt states that will be fine he will wait until 12-14 to discuss which  Rx would be good for him

## 2016-04-28 ENCOUNTER — Telehealth: Payer: Self-pay | Admitting: Family Medicine

## 2016-04-28 NOTE — Telephone Encounter (Signed)
Medicaid has denied MRI of  Cervical Spine.  Appt for 05/05/16 canceled.  I left pt a message informing him and have given denial to provider for further advise.

## 2016-04-28 NOTE — Telephone Encounter (Signed)
Tell pt that Medicaid currently is denying the MRI. However tell Rick Kim that I am concerned that there is abnormality in his neck and he does need an MRI. However a lot of times Medicaid just requires documentation that some things have been tried prior to them paying for an MRI. Tell Rick Kim that I recommend he take prednisone taper and then come in for follow-up office visit with me so that we can have further documentation and then try to get MRI approved. Send prescription for-- prednisone 20 mg --------------------Take 3 daily for 2 days then take 2 daily for 2 days then take 1 daily for 2 days ---# 12 +0

## 2016-04-29 ENCOUNTER — Ambulatory Visit (INDEPENDENT_AMBULATORY_CARE_PROVIDER_SITE_OTHER): Payer: Medicaid Other | Admitting: Physician Assistant

## 2016-04-29 ENCOUNTER — Encounter: Payer: Self-pay | Admitting: Physician Assistant

## 2016-04-29 VITALS — BP 130/72 | HR 84 | Temp 98.1°F | Resp 18 | Wt 235.0 lb

## 2016-04-29 DIAGNOSIS — F17200 Nicotine dependence, unspecified, uncomplicated: Secondary | ICD-10-CM

## 2016-04-29 DIAGNOSIS — G4700 Insomnia, unspecified: Secondary | ICD-10-CM

## 2016-04-29 DIAGNOSIS — M489 Spondylopathy, unspecified: Secondary | ICD-10-CM | POA: Diagnosis not present

## 2016-04-29 MED ORDER — PREDNISONE 20 MG PO TABS: 20.0000 mg | ORAL_TABLET | Freq: Every day | ORAL | 0 refills | Status: DC

## 2016-04-29 MED ORDER — ZOLPIDEM TARTRATE 10 MG PO TABS: 10.0000 mg | ORAL_TABLET | Freq: Every evening | ORAL | 2 refills | Status: DC | PRN

## 2016-04-29 MED ORDER — BUPROPION HCL ER (SR) 150 MG PO TB12: ORAL_TABLET | ORAL | 1 refills | Status: DC

## 2016-04-29 NOTE — Progress Notes (Signed)
Patient ID: LEVESTER RISTIC MRN: [id number], DOB: [birth year] 45 y.o. Date of Encounter: 04/29/2016, 8:32 AM    Chief Complaint: 1 week f/u OV to f/u bilateral arm paresthesias, insomnia  HPI: 45 y.o. y/o male presents with above.    04/22/2016:  here for CPE.   He had OV 07/2012 for complete physical exam with Dr. Dennard Schaumann. He had CPE with me 03/06/2014. He said that prior to that he come here off and on for many many years.  Today he does have a couple of issues to report/discuss.  States that he has been having problems with pain and paresthesias down his arms. He says that he feels it all the way down the entire arm. Says that he feels a throbbing discomfort and also a feeling as if they have "fallen asleep ". Says that if it happens during the daytime then he is able to shake out his arms and move them around. Says that at night he will wake up and they just feel sore. Says that sometimes when he is "gripping the steering wheel and also when he is drawing or painting --at these times his entire arms bilaterally will feel numb. Asked if he has felt any neck pain. 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 Has not been dropping any tools etc.  He also says that he is getting "no sleep"--says that his "mind won't shut off". So, he feels like he has absolutely no energy and feels exhausted. Says that he will get in bed but then just stares at the wall etc. and can't sleep. Says that recently he has just been falling asleep in the recliner watching TV. Even so he still doesn't fall asleep until around 1 or 2 in the morning. Then he says that if anything at all  wakes him up, then he can't go back to sleep. "If the dog barks then it's all over." Asked how often this happens but he says that usually will have to wake up to go to the bathroom or something and then he never can get back to sleep. He does not take any naps during the day.  Also discussed that at his physical 02/2014 I had ordered a chest x-ray but he did not have it done. Says he was unable to get a ride at that time but that he does want to get that chest x-ray. Noted that I had prescribed Wellbutrin for smoking cessation and he says he doesn't think he ever used it. Is currently smoking 1 pack per day.  No other complaints or concerns.  AT THAT VISIT:  --Updated Preventive Care --Ordered CXR --Ordered Ambien 10mg  QHS --Ordered XRay Cervical Spine  CBC TSH CMET were all normal. Lipid panel was skipped because he was not fasting at that visit and he had a lipid panel just a couple years ago that was WNL  Chest x-ray was performed and showed findings consistent with COPD, smoking history-- otherwise was normal.  X-ray cervical spine did show : "FINDINGS: The cervical vertebral bodies are preserved in height. There is mild disc  space narrowing at C3-4. The prevertebral soft tissue spaces are normal. There is no perched facet. There is mild bony encroachment upon the neural foramina at the C3-4 level bilaterally. The odontoid is intact.  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 Mild bony encroachment upon the neural foramen bilaterally at this level. Given the patient's symptoms, cervical spine MRI would be a useful next imaging step."   When I obtained x-ray results of cervical spine, I recommended ordering MRI cervical spine. However at this time Medicaid is not approving this. Therefore I had planned for him to treat with prednisone taper and then have follow-up office visit so that we can have further documentation in order to get the MRI report approved.  When we  called patient with chest x-ray results, he requested prescription for Chantix for smoking cessation. At that time I recommended that he wait to discuss this at today's visit.   04/29/2016: Today he reports that the Ambien has been working well. At last visit he reported not falling asleep until around 1:30 or 2 AM. Now he says that he takes the Ambien around 9 PM and is asleep by 945. He has been getting good sleep until he may be awoken with something such as needing to go to the bathroom or his kids etc. Says that he has 14-year-old to Wednesday one is a boy one is a girl. The Ambien is causing no adverse effects. Says that he feels much better since he has been getting more sleep.  He does will want to take a medicine to help him quit smoking. He did use Chantix in the past and it "made him feel weird" and he quit it. Therefore today I have recommended that we use Wellbutrin instead. He is agreeable with this.  Today I have discussed the results of the cervical spine x-ray. I have also discussed the fact that Medicaid would not cover his MRI at this point. Says that we need to have a trial of medication and follow-up visit and then try to order the MRI again. And then having follow-up visit and he is agreeable with this approach. States that those symptoms in his arm are stable and the same as at last visit. Developing no weakness in his arms or hands.  Review of Systems: Consitutional: No fever, chills, fatigue, night sweats, lymphadenopathy, or weight changes. Eyes: No visual changes, eye redness, or discharge. ENT/Mouth: Ears: No otalgia, tinnitus, hearing loss, discharge. Nose: No congestion, rhinorrhea, sinus pain, or epistaxis. Throat: No sore throat, post nasal drip, or teeth pain. Cardiovascular: No CP, palpitations, diaphoresis, DOE, edema, orthopnea, PND. Respiratory: No cough, hemoptysis, SOB, or wheezing. Gastrointestinal: No anorexia, dysphagia, reflux, pain, nausea, vomiting,  hematemesis, diarrhea, constipation, BRBPR, or melena. Genitourinary: No dysuria, frequency, urgency, hematuria, incontinence, nocturia, decreased urinary stream, discharge, impotence, or testicular pain/masses. Musculoskeletal: No decreased ROM, myalgias, stiffness, joint swelling, or weakness. Skin: No rash, erythema, lesion changes, pain, warmth, jaundice, or pruritis. Neurological: No headache, dizziness, syncope, seizures, tremors, memory loss, coordination problems, or paresthesias. Psychological: No anxiety, depression, hallucinations, SI/HI. Endocrine: No fatigue, polydipsia, polyphagia, polyuria, or known diabetes. All other systems were reviewed and are otherwise negative.  Past Medical History:  Diagnosis Date  . Disorder of lumbar spine   . Smoker   He says  that he has had problems with discs in his lumbar spine. Says he has had injections done to the area --at [facility]-- in the past.  No past surgical history on file.  Home Meds:  None  Allergies: No Known Allergies  Social History   Social History  . Marital status: Divorced    Spouse name: N/A  . Number of children: N/A  . Years of education: N/A   Occupational History  . Not on file.   Social History Main Topics  . Smoking status: Current Every Day Smoker    Packs/day: 1.00    Types: Cigarettes  . Smokeless tobacco: Never Used  . Alcohol use Not on file     Comment: socially  . Drug use: No  . Sexual activity: Not on file   Other Topics Concern  . Not on file   Social History Narrative   Entered 2015:   Married.   4 kids -- Ages 32 y/o, 23 y/o, Twins 49 y/o   2 older kids from different marriage.    Twins from new marriage --this wife had never had kids until these twins.   Works on [employer].   He walks around neighborhood routinely and rides bike.          Family History  Problem Relation Age of Onset  . Cancer Sister 82    Lung Cancer  . Alcohol abuse Father     Physical  Exam: Blood pressure 130/72, pulse 84, temperature 98.1 F (36.7 C), temperature source Oral, resp. rate 18, weight 235 lb (106.6 kg), SpO2 98 %.  General: Well developed, well nourished, WM. Appears in no acute distress. Neck: Supple. Trachea midline. No thyromegaly.  No lymphadenopathy. Lungs: Clear to auscultation bilaterally without wheezes, rales, or rhonchi. Breathing is of normal effort and unlabored. Cardiovascular: RRR with S1 S2. No murmurs, rubs, or gallops. Distal pulses 2+ symmetrically. No carotid or abdominal bruits. Abdomen: Soft, non-tender, non-distended with normoactive bowel sounds. No hepatosplenomegaly or masses. No rebound/guarding. No CVA tenderness. No hernias.  Musculoskeletal: Full range of motion and 5/5 strength throughout. Without swelling, atrophy, tenderness, crepitus, or warmth.  He does have tenderness with palpation along both sides of his neck and down towards the scapula covering the entire area of the trapezius etc. He has pretty good range of motion of his neck. So had him perform range of motion of the shoulders and he has full range of motion but when he abducts does say that that causes pain up in the tops of the shoulders. He has strong 5/5 grip strength and upper extremity strength bilaterally. Skin: He has lots of tattoos-- on arms, chest, back   Warm and moist without erythema, ecchymosis, wounds, or rash. Neuro: A+Ox3. CN II-XII grossly intact. Moves all extremities spontaneously. Full sensation throughout. Normal gait. Psych:  Responds to questions appropriately with a normal affect.   Assessment/Plan:  45 y.o. y/o white male here for    1. Insomnia, unspecified type The Ambien is working well. However he says that he was given #15. I will order a new prescription for #30. - zolpidem (AMBIEN) 10 MG tablet; Take 1 tablet (10 mg total) by mouth at bedtime as needed for sleep.  Dispense: 30 tablet; Refill: 2  2. Smoker At his office visit here  07/2012 Chantix was prescribed for smoking cessation. Patient reports that he took the medication for about 2 weeks but then it made him feel weird and actually even scared him so he quit taking it. However he says that he does want to quit smoking and would like to use any other treatment to help him with this. At CPE 02/2014--- I have given him a  handout with Tips for Cessation.  At CPE 02/2014--- I also prescribed Wellbutrin. However he never took this. 04/22/2016: Will rediscuss smoking cessation at follow-up visit.  04/29/2016: Given the adverse effects he had with Chantix 07/2012 think we should avoid this. At this time I prescribed Wellbutrin SR 150 mg--- take 1 daily for 5 days then increase to 1 twice a day. Today I also gave and reviewed handout with tips for cessation to see if this may help him to quit as well. He is to take the prednisone taper--- see #3 below----he is to take this first and then once the symptoms wear off from the prednisone and then he is to start the Wellbutrin. Now 100 start Wellbutrin at the same time as the prednisone and then not know what is causing what symptoms.  3. Cervical spine disease Paresthesia of arm Neuropathic pain, arm  He had x-ray cervical spine. That report is included in history of present illness above. I then ordered MRI cervical spine Medicaid denied at this time. At this point I am ordering prednisone taper--20 mg tab--- take 3 daily for 2 days then 2 daily for 2 days and then 1 daily for 2 days. He will schedule follow-up office visit here in about 2 weeks' time. We will didn't document his symptoms at that point and if symptoms persist will order MRI again. If Medicaid continues to deny approval for MRI then I will just refer him to neurosurgery.  Follow-up office visit 2 weeks or sooner if needed.     THE FOLLOWING IS COPIED FROM PREVENTIVE CARE NOTE 04/22/2016---NOT ADDRESSED AT F/U [facility]  Visit for preventive health examination  A.  Screening Labs: He is not fasting today. Reviewed his lipid panel from 02/2014 was okay so we can just skip rechecking lipid panel today and go ahead and check other labs while he is here. - CBC with Differential - COMPLETE METABOLIC PANEL WITH GFR - TSH  B. Screening For Prostate Cancer:  Not indicated until age 23  C. Screening For Colorectal Cancer:   Not indicated until age 63  D. Immunizations: Flu--- patient is agreeable to receive influenza vaccine here today. Tetanus-- Tdap--Given here 03/06/2014 Pneumococcal--- given that he is a smoker he does need pneumonia vaccine. --Pneumovax 23---Given here 04/22/2016.   -- Further Pneumonia Vaccine not due until age 48 Zostavax----not indicated until age 63.      Signed:   [address] [place], [place]  04/29/2016 8:32 AM

## 2016-04-29 NOTE — Telephone Encounter (Signed)
Pt had an appt today discussed Mri status and ordered prednisone 20 mg

## 2016-04-30 NOTE — Telephone Encounter (Signed)
Pt was seen 12/14 and neck pain and MRI were discussed

## 2016-05-05 ENCOUNTER — Other Ambulatory Visit: Payer: Medicaid Other

## 2016-05-13 ENCOUNTER — Ambulatory Visit (INDEPENDENT_AMBULATORY_CARE_PROVIDER_SITE_OTHER): Payer: Medicaid Other | Admitting: Physician Assistant

## 2016-05-13 ENCOUNTER — Encounter: Payer: Self-pay | Admitting: Physician Assistant

## 2016-05-13 VITALS — BP 110/76 | HR 86 | Temp 98.6°F | Resp 18 | Ht 73.0 in | Wt 249.0 lb

## 2016-05-13 DIAGNOSIS — F17200 Nicotine dependence, unspecified, uncomplicated: Secondary | ICD-10-CM | POA: Diagnosis not present

## 2016-05-13 DIAGNOSIS — G4700 Insomnia, unspecified: Secondary | ICD-10-CM | POA: Diagnosis not present

## 2016-05-13 DIAGNOSIS — M489 Spondylopathy, unspecified: Secondary | ICD-10-CM | POA: Diagnosis not present

## 2016-05-13 NOTE — Progress Notes (Signed)
Patient ID: Rick Kim MRN: [id number], DOB: Jan 11, 1971 45 y.o. Date of Encounter: 05/13/2016, 9:14 AM    Chief Complaint: 1 week f/u OV to f/u bilateral arm paresthesias, insomnia  HPI: 45 y.o. y/o male presents with above.    04/22/2016:  here for CPE.   He had OV 07/2012 for complete physical exam with Dr. Dennard Schaumann. He had CPE with me 03/06/2014. He said that prior to that he come here off and on for many many years.  Today he does have a couple of issues to report/discuss.  States that he has been having problems with pain and paresthesias down his arms. He says that he feels it all the way down the entire arm. Says that he feels a throbbing discomfort and also a feeling as if they have "fallen asleep ". Says that if it happens during the daytime then he is able to shake out his arms and move them around. Says that at night he will wake up and they just feel sore. Says that sometimes when he is "gripping the steering wheel and also when he is drawing or painting --at these times his entire arms bilaterally will feel numb. Asked if he has felt any neck pain. 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 Has not been dropping any tools etc.  He also says that he is getting "no sleep"--says that his "mind won't shut off". So, he feels like he has absolutely no energy and feels exhausted. Says that he will get in bed but then just stares at the wall etc. and can't sleep. Says that recently he has just been falling asleep in the recliner watching TV. Even so he still doesn't fall asleep until around 1 or 2 in the morning. Then he says that if anything at all  wakes him up, then he can't go back to sleep. "If the dog barks then it's all over." Asked how often this happens but he says that usually will have to wake up to go to the bathroom or something and then he never can get back to sleep. He does not take any naps during the day.  Also discussed that at his physical 02/2014 I had ordered a chest x-ray but he did not have it done. Says he was unable to get a ride at that time but that he does want to get that chest x-ray. Noted that I had prescribed Wellbutrin for smoking cessation and he says he doesn't think he ever used it. Is currently smoking 1 pack per day.  No other complaints or concerns.  AT THAT VISIT:  --Updated Preventive Care --Ordered CXR --Ordered Ambien 10mg  QHS --Ordered XRay Cervical Spine  CBC TSH CMET were all normal. Lipid panel was skipped because he was not fasting at that visit and he had a lipid panel just a couple years ago that was WNL  Chest x-ray was performed and showed findings consistent with COPD, smoking history-- otherwise was normal.  X-ray cervical spine did show : "FINDINGS: The cervical vertebral bodies are preserved in height. There is mild disc  space narrowing at C3-4. The prevertebral soft tissue spaces are normal. There is no perched facet. There is mild bony encroachment upon the neural foramina at the C3-4 level bilaterally. The odontoid is intact.  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 Mild bony encroachment upon the neural foramen bilaterally at this level. Given the patient's symptoms, cervical spine MRI would be a useful next imaging step."   When I obtained x-ray results of cervical spine, I recommended ordering MRI cervical spine. However at this time Medicaid is not approving this. Therefore I had planned for him to treat with prednisone taper and then have follow-up office visit so that we can have further documentation in order to get the MRI report approved.  When we  called patient with chest x-ray results, he requested prescription for Chantix for smoking cessation. At that time I recommended that he wait to discuss this at today's visit.   04/29/2016: Today he reports that the Ambien has been working well. At last visit he reported not falling asleep until around 1:30 or 2 AM. Now he says that he takes the Ambien around 9 PM and is asleep by 945. He has been getting good sleep until he may be awoken with something such as needing to go to the bathroom or his kids etc. Says that he has 96-year-old to Wednesday one is a boy one is a girl. The Ambien is causing no adverse effects. Says that he feels much better since he has been getting more sleep.  He does will want to take a medicine to help him quit smoking. He did use Chantix in the past and it "made him feel weird" and he quit it. Therefore today I have recommended that we use Wellbutrin instead. He is agreeable with this.  Today I have discussed the results of the cervical spine x-ray. I have also discussed the fact that Medicaid would not cover his MRI at this point. Says that we need to have a trial of medication and follow-up visit and then try to order the MRI again. And then having follow-up visit and he is agreeable with this approach. States that those symptoms in his arm are stable and the same as at last visit. Developing no weakness in his arms or hands.  AT THAT OV: Continued Ambien 10mg  Prescribed Wellbutrin for Smoking Cessation, Gave handout with tips for cessation Prescribed Prednisone taper-- for Paresthesias, Pain in Arms, Cervical Disc Disease   05/13/2016; Day he reports that the Ambien continues to work well. Continuing to get much better sleep. He reports that he is taking the Wellbutrin. It is causing no adverse effects. That it is working. He has decreased smoking to just very few cigarettes. Is hoping he can completely quit in the very near future. He reports that he took the  prednisone taper as directed. However, noticed absolutely no change in his symptoms. Has continued to wake up at night and has to "shake his arms out". Continuing to have paresthesias down both arms. Even while taking the prednisone, the symptoms never decreased at all.  Review of Systems: Consitutional: No fever, chills, fatigue, night sweats, lymphadenopathy, or weight changes. Eyes: No visual changes, eye redness, or discharge. ENT/Mouth: Ears: No otalgia, tinnitus, hearing loss, discharge. Nose: No congestion, rhinorrhea, sinus pain, or epistaxis. Throat: No sore throat, post nasal drip, or teeth pain. Cardiovascular: No CP, palpitations, diaphoresis, DOE, edema, orthopnea, PND. Respiratory: No cough, hemoptysis, SOB, or wheezing. Gastrointestinal: No anorexia, dysphagia, reflux, pain, nausea, vomiting,  hematemesis, diarrhea, constipation, BRBPR, or melena. Genitourinary: No dysuria, frequency, urgency, hematuria, incontinence, nocturia, decreased urinary stream, discharge, impotence, or testicular pain/masses. Musculoskeletal: No decreased ROM, myalgias, stiffness, joint swelling, or weakness. Skin: No rash, erythema, lesion changes, pain, warmth, jaundice, or pruritis. Neurological: No headache, dizziness, syncope, seizures, tremors, memory loss, coordination problems, or paresthesias. Psychological: No anxiety, depression, hallucinations, SI/HI. Endocrine: No fatigue, polydipsia, polyphagia, polyuria, or known diabetes. All other systems were reviewed and are otherwise negative.  Past Medical History:  Diagnosis Date  . Disorder of lumbar spine   . Smoker   He says  that he has had problems with discs in his lumbar spine. Says he has had injections done to the area --at [facility]-- in the past.  No past surgical history on file.  Home Meds:  None  Allergies: No Known Allergies  Social History   Social History  . Marital status: Divorced    Spouse name: N/A  .  Number of children: N/A  . Years of education: N/A   Occupational History  . Not on file.   Social History Main Topics  . Smoking status: Current Every Day Smoker    Packs/day: 1.00    Types: Cigarettes  . Smokeless tobacco: Never Used  . Alcohol use Not on file     Comment: socially  . Drug use: No  . Sexual activity: Not on file   Other Topics Concern  . Not on file   Social History Narrative   Entered 2015:   Married.   4 kids -- Ages 72 y/o, 62 y/o, Twins 28 y/o   2 older kids from different marriage.    Twins from new marriage --this wife had never had kids until these twins.   Works on [employer].   He walks around neighborhood routinely and rides bike.          Family History  Problem Relation Age of Onset  . Cancer Sister 64    Lung Cancer  . Alcohol abuse Father     Physical Exam: Blood pressure 110/76, pulse 86, temperature 98.6 F (37 C), temperature source Oral, resp. rate 18, height 6\' 1"  (1.854 m), weight 249 lb (112.9 kg).  General: Well developed, well nourished, WM. Appears in no acute distress. Neck: Supple. Trachea midline. No thyromegaly.  No lymphadenopathy. Lungs: Clear to auscultation bilaterally without wheezes, rales, or rhonchi. Breathing is of normal effort and unlabored. Cardiovascular: RRR with S1 S2. No murmurs, rubs, or gallops. Distal pulses 2+ symmetrically. No carotid or abdominal bruits. Abdomen: Soft, non-tender, non-distended with normoactive bowel sounds. No hepatosplenomegaly or masses. No rebound/guarding. No CVA tenderness. No hernias.  Musculoskeletal: Full range of motion and 5/5 strength throughout. Without swelling, atrophy, tenderness, crepitus, or warmth.  He does have tenderness with palpation along both sides of his neck and down towards the scapula covering the entire area of the trapezius etc. He has pretty good range of motion of his neck. So had him perform range of motion of the shoulders and he has full range of motion but  when he abducts does say that that causes pain up in the tops of the shoulders. He has strong 5/5 grip strength and upper extremity strength bilaterally. Skin: He has lots of tattoos-- on arms, chest, back   Warm and moist without erythema, ecchymosis, wounds, or rash. Neuro: A+Ox3. CN II-XII grossly intact. Moves all extremities spontaneously. Full sensation throughout. Normal gait. Psych:  Responds to questions appropriately with a normal affect.  Assessment/Plan:  45 y.o. y/o white male here for     1. Cervical spine disease He has had x-ray cervical spine which shows abnormality. He has taken prednisone taper with absolutely no improvement in symptoms. Will obtain MRI cervical spine. - MR Cervical Spine Wo Contrast; Future  2. Smoker He will continue the Wellbutrin SR 150 mg twice a day.  3. Insomnia, unspecified type He will continue Ambien 10 mg daily at bedtime.  He will schedule routine follow-up visit with me in 3 months. F/U sooner if needed. I will contact him once I get results of the MRI cervical spine.    THE FOLLOWING IS COPIED FROM PREVENTIVE CARE NOTE 04/22/2016---NOT ADDRESSED AT F/U [facility]  Visit for preventive health examination  A. Screening Labs: He is not fasting today. Reviewed his lipid panel from 02/2014 was okay so we can just skip rechecking lipid panel today and go ahead and check other labs while he is here. - CBC with Differential - COMPLETE METABOLIC PANEL WITH GFR - TSH  B. Screening For Prostate Cancer:  Not indicated until age 26  C. Screening For Colorectal Cancer:   Not indicated until age 68  D. Immunizations: Flu--- patient is agreeable to receive influenza vaccine here today. Tetanus-- Tdap--Given here 03/06/2014 Pneumococcal--- given that he is a smoker he does need pneumonia vaccine. --Pneumovax 23---Given here 04/22/2016.   -- Further Pneumonia Vaccine not due until age 39 Zostavax----not indicated until age  28.      Signed:   [address] [place], [place]  05/13/2016 9:14 AM

## 2016-05-14 ENCOUNTER — Telehealth: Payer: Self-pay

## 2016-05-14 NOTE — Telephone Encounter (Signed)
Started PA for MRI Cervical Spine w/o Contrast: [id number] Called [facility]: Ph#[phone] Faxed OV notes to: [phone] Case# [id number] Allow 2 business days for processing.

## 2016-05-20 ENCOUNTER — Telehealth: Payer: Self-pay | Admitting: Family Medicine

## 2016-05-20 MED ORDER — CYCLOBENZAPRINE HCL 10 MG PO TABS: 10.0000 mg | ORAL_TABLET | Freq: Three times a day (TID) | ORAL | 1 refills | Status: DC | PRN

## 2016-05-20 NOTE — Telephone Encounter (Signed)
rec'd denial for MRI Cervical Spine requested.  Per provider, have pt to follow up at beginning of February.  Call in [place] for spasms, caution pt about drowsiness.  I spoke to provider and he has been made aware of above and follow up appt made.

## 2016-06-16 ENCOUNTER — Telehealth: Payer: Self-pay

## 2016-06-16 NOTE — Telephone Encounter (Signed)
Pt called and req an antibiotic(z pak) be called him for him. When asked what sympotms he was having pt stated he was stopped up had body aches as well as a sore throat.  Pt was advised that he needed to be seen in office before a Rx could be prescribed. Pt was informed he was last seen in office on 05-13-2016 and he needed to be seen seen for his symptoms offered to make appt today. Pt stated he would call bck if it got worse.

## 2016-06-21 ENCOUNTER — Encounter: Payer: Self-pay | Admitting: Physician Assistant

## 2016-06-21 ENCOUNTER — Ambulatory Visit (INDEPENDENT_AMBULATORY_CARE_PROVIDER_SITE_OTHER): Payer: Medicaid Other | Admitting: Physician Assistant

## 2016-06-21 VITALS — BP 130/90 | HR 78 | Temp 97.5°F | Resp 18 | Wt 252.4 lb

## 2016-06-21 DIAGNOSIS — R202 Paresthesia of skin: Secondary | ICD-10-CM | POA: Diagnosis not present

## 2016-06-21 DIAGNOSIS — M79601 Pain in right arm: Secondary | ICD-10-CM

## 2016-06-21 DIAGNOSIS — M79602 Pain in left arm: Secondary | ICD-10-CM | POA: Diagnosis not present

## 2016-06-21 DIAGNOSIS — M489 Spondylopathy, unspecified: Secondary | ICD-10-CM | POA: Diagnosis not present

## 2016-06-21 NOTE — Progress Notes (Signed)
Patient ID: Rick Kim MRN: [id number], DOB: 1970-09-18 46 y.o. Date of Encounter: 06/21/2016, 8:16 AM    Chief Complaint: 1 week f/u OV to f/u bilateral arm paresthesias, insomnia  HPI: 46 y.o. y/o male presents with above.    04/22/2016:  here for CPE.   He had OV 07/2012 for complete physical exam with Dr. Dennard Schaumann. He had CPE with me 03/06/2014. He said that prior to that he come here off and on for many many years.  Today he does have a couple of issues to report/discuss.  States that he has been having problems with pain and paresthesias down his arms. He says that he feels it all the way down the entire arm. Says that he feels a throbbing discomfort and also a feeling as if they have "fallen asleep ". Says that if it happens during the daytime then he is able to shake out his arms and move them around. Says that at night he will wake up and they just feel sore. Says that sometimes when he is "gripping the steering wheel and also when he is drawing or painting --at these times his entire arms bilaterally will feel numb. Asked if he has felt any neck pain. 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 Has not been dropping any tools etc.  He also says that he is getting "no sleep"--says that his "mind won't shut off". So, he feels like he has absolutely no energy and feels exhausted. Says that he will get in bed but then just stares at the wall etc. and can't sleep. Says that recently he has just been falling asleep in the recliner watching TV. Even so he still doesn't fall asleep until around 1 or 2 in the morning. Then he says that if anything at all  wakes him up, then he can't go back to sleep. "If the dog barks then it's all over." Asked how often this happens but he says that usually will have to wake up to go to the bathroom or something and then he never can get back to sleep. He does not take any naps during the day.  Also discussed that at his physical 02/2014 I had ordered a chest x-ray but he did not have it done. Says he was unable to get a ride at that time but that he does want to get that chest x-ray. Noted that I had prescribed Wellbutrin for smoking cessation and he says he doesn't think he ever used it. Is currently smoking 1 pack per day.  No other complaints or concerns.  AT THAT VISIT:  --Updated Preventive Care --Ordered CXR --Ordered Ambien 10mg  QHS --Ordered XRay Cervical Spine  CBC TSH CMET were all normal. Lipid panel was skipped because he was not fasting at that visit and he had a lipid panel just a couple years ago that was WNL  Chest x-ray was performed and showed findings consistent with COPD, smoking history-- otherwise was normal.  X-ray cervical spine did show : "FINDINGS: The cervical vertebral bodies are preserved in height. There is mild disc  space narrowing at C3-4. The prevertebral soft tissue spaces are normal. There is no perched facet. There is mild bony encroachment upon the neural foramina at the C3-4 level bilaterally. The odontoid is intact.  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 Mild bony encroachment upon the neural foramen bilaterally at this level. Given the patient's symptoms, cervical spine MRI would be a useful next imaging step."   When I obtained x-ray results of cervical spine, I recommended ordering MRI cervical spine. However at this time Medicaid is not approving this. Therefore I had planned for him to treat with prednisone taper and then have follow-up office visit so that we can have further documentation in order to get the MRI report approved.  When we  called patient with chest x-ray results, he requested prescription for Chantix for smoking cessation. At that time I recommended that he wait to discuss this at today's visit.   04/29/2016: Today he reports that the Ambien has been working well. At last visit he reported not falling asleep until around 1:30 or 2 AM. Now he says that he takes the Ambien around 9 PM and is asleep by 945. He has been getting good sleep until he may be awoken with something such as needing to go to the bathroom or his kids etc. Says that he has 70-year-old to Wednesday one is a boy one is a girl. The Ambien is causing no adverse effects. Says that he feels much better since he has been getting more sleep.  He does will want to take a medicine to help him quit smoking. He did use Chantix in the past and it "made him feel weird" and he quit it. Therefore today I have recommended that we use Wellbutrin instead. He is agreeable with this.  Today I have discussed the results of the cervical spine x-ray. I have also discussed the fact that Medicaid would not cover his MRI at this point. Says that we need to have a trial of medication and follow-up visit and then try to order the MRI again. And then having follow-up visit and he is agreeable with this approach. States that those symptoms in his arm are stable and the same as at last visit. Developing no weakness in his arms or hands.  AT THAT OV: Continued Ambien 10mg  Prescribed Wellbutrin for Smoking Cessation, Gave handout with tips for cessation Prescribed Prednisone taper-- for Paresthesias, Pain in Arms, Cervical Disc Disease   05/13/2016: Today he reports that the Ambien continues to work well. Continuing to get much better sleep. He reports that he is taking the Wellbutrin. It is causing no adverse effects. That it is working. He has decreased smoking to just very few cigarettes. Is hoping he can completely quit in the very near future. He reports that he took the  prednisone taper as directed. However, noticed absolutely no change in his symptoms. Has continued to wake up at night and has to "shake his arms out". Continuing to have paresthesias down both arms. Even while taking the prednisone, the symptoms never decreased at all.   06/21/2016: At [place] 05/13/16 I placed another order for MRI cervical spine.  Medicaid denied it again ---05/20/2016---sent in prescription for Flexeril 10 mg 1 by mouth 3 times a day when necessary and cautioned regarding drowsiness. Medicaid paperwork states that patient has to have a failure to improve after a 6 week trial of physician - guided clinical care (treatment or observation) and clinical reevaluation. Today patient states that he  is taking the Flexeril every night but cannot take it during the day because it does make him drowsy and cannot take it with his work. Is taking it every night. Has not noticing a big difference in symptoms. Asked if he is dropping any objects because of decreased strength and decreased grip. Says that if he feels his hand getting numb, then he will go ahead and set down an object and shake his hands and arms out and move his arms and does this before it gets to that point of dropping an object. Still having weakness paresthesias pain in both arms and hands.     Review of Systems: Consitutional: No fever, chills, fatigue, night sweats, lymphadenopathy, or weight changes. Eyes: No visual changes, eye redness, or discharge. ENT/Mouth: Ears: No otalgia, tinnitus, hearing loss, discharge. Nose: No congestion, rhinorrhea, sinus pain, or epistaxis. Throat: No sore throat, post nasal drip, or teeth pain. Cardiovascular: No CP, palpitations, diaphoresis, DOE, edema, orthopnea, PND. Respiratory: No cough, hemoptysis, SOB, or wheezing. Gastrointestinal: No anorexia, dysphagia, reflux, pain, nausea, vomiting, hematemesis, diarrhea, constipation, BRBPR, or melena. Genitourinary: No dysuria, frequency, urgency,  hematuria, incontinence, nocturia, decreased urinary stream, discharge, impotence, or testicular pain/masses. Musculoskeletal: No decreased ROM, myalgias, stiffness, joint swelling, or weakness. Skin: No rash, erythema, lesion changes, pain, warmth, jaundice, or pruritis. Neurological: No headache, dizziness, syncope, seizures, tremors, memory loss, coordination problems, or paresthesias. Psychological: No anxiety, depression, hallucinations, SI/HI. Endocrine: No fatigue, polydipsia, polyphagia, polyuria, or known diabetes. All other systems were reviewed and are otherwise negative.  Past Medical History:  Diagnosis Date  . Disorder of lumbar spine   . Smoker   He says  that he has had problems with discs in his lumbar spine. Says he has had injections done to the area --at [facility]-- in the past.  No past surgical history on file.  Home Meds:  None  Allergies: No Known Allergies  Social History   Social History  . Marital status: Divorced    Spouse name: N/A  . Number of children: N/A  . Years of education: N/A   Occupational History  . Not on file.   Social History Main Topics  . Smoking status: Current Every Day Smoker    Packs/day: 1.00    Types: Cigarettes  . Smokeless tobacco: Never Used  . Alcohol use Not on file     Comment: socially  . Drug use: No  . Sexual activity: Not on file   Other Topics Concern  . Not on file   Social History Narrative   Entered 2015:   Married.   4 kids -- Ages 103 y/o, 32 y/o, Twins 37 y/o   2 older kids from different marriage.    Twins from new marriage --this wife had never had kids until these twins.   Works on [employer].   He walks around neighborhood routinely and rides bike.          Family History  Problem Relation Age of Onset  . Cancer Sister 50    Lung Cancer  . Alcohol abuse Father     Physical Exam: Blood pressure 130/90, pulse 78, temperature 97.5 F (36.4 C), temperature source Oral, resp. rate  18, weight 252 lb 6.4 oz (114.5 kg), SpO2 98 %.  General: Well developed, well nourished, WM. Appears in no acute distress. Neck: Supple. Trachea midline. No thyromegaly.  No lymphadenopathy. Lungs: Clear to auscultation bilaterally without wheezes, rales, or rhonchi. Breathing is of normal effort and unlabored. Cardiovascular:  RRR with S1 S2. No murmurs, rubs, or gallops. Distal pulses 2+ symmetrically. No carotid or abdominal bruits. Abdomen: Soft, non-tender, non-distended with normoactive bowel sounds. No hepatosplenomegaly or masses. No rebound/guarding. No CVA tenderness. No hernias.  Musculoskeletal: Full range of motion and 5/5 strength throughout. Without swelling, atrophy, tenderness, crepitus, or warmth.  He does have tenderness with palpation along both sides of his neck and down towards the scapula covering the entire area of the trapezius etc. He has pretty good range of motion of his neck. Also had him perform range of motion of the shoulders and he has full range of motion but when he abducts does say that that causes pain up in the tops of the shoulders. He has strong 5/5 grip strength and upper extremity strength bilaterally. Skin: He has lots of tattoos-- on arms, chest, back   Warm and moist without erythema, ecchymosis, wounds, or rash. Neuro: A+Ox3. CN II-XII grossly intact. Moves all extremities spontaneously. Full sensation throughout. Normal gait. Psych:  Responds to questions appropriately with a normal affect.   Assessment/Plan:  46 y.o. y/o white male here for     1. Cervical spine disease He has had x-ray cervical spine which shows abnormality. He has taken prednisone taper with absolutely no improvement in symptoms. He is using Flexeril every night and noticing no significant improvement. At this point he has undergone treatment for greater than 6 weeks so hopefully Medicaid will approve MRI at this point. Will obtain MRI cervical spine. - MR Cervical Spine Wo  Contrast; Future  2. Smoker He will continue the Wellbutrin SR 150 mg twice a day.  3. Insomnia, unspecified type He will continue Ambien 10 mg daily at bedtime.  He will schedule routine follow-up visit with me in 3 months. F/U sooner if needed. I will contact him once I get results of the MRI cervical spine.    THE FOLLOWING IS COPIED FROM PREVENTIVE CARE NOTE 04/22/2016---NOT ADDRESSED AT F/U [facility]  Visit for preventive health examination  A. Screening Labs: He is not fasting today. Reviewed his lipid panel from 02/2014 was okay so we can just skip rechecking lipid panel today and go ahead and check other labs while he is here. - CBC with Differential - COMPLETE METABOLIC PANEL WITH GFR - TSH  B. Screening For Prostate Cancer:  Not indicated until age 40  C. Screening For Colorectal Cancer:   Not indicated until age 83  D. Immunizations: Flu--- patient is agreeable to receive influenza vaccine here today. Tetanus-- Tdap--Given here 03/06/2014 Pneumococcal--- given that he is a smoker he does need pneumonia vaccine. --Pneumovax 23---Given here 04/22/2016.   -- Further Pneumonia Vaccine not due until age 55 Zostavax----not indicated until age 40.      Signed:   [address] [place], [place]  06/21/2016 8:16 AM

## 2016-07-03 ENCOUNTER — Other Ambulatory Visit: Payer: Medicaid Other

## 2016-07-09 ENCOUNTER — Other Ambulatory Visit: Payer: Medicaid Other

## 2016-07-17 ENCOUNTER — Ambulatory Visit
Admission: RE | Admit: 2016-07-17 | Discharge: 2016-07-17 | Disposition: A | Discharge status: Home / Self Care | Payer: Medicaid Other | Source: Ambulatory Visit | Attending: Physician Assistant | Admitting: Physician Assistant

## 2016-07-17 DIAGNOSIS — R202 Paresthesia of skin: Secondary | ICD-10-CM

## 2016-07-17 DIAGNOSIS — M489 Spondylopathy, unspecified: Secondary | ICD-10-CM

## 2016-07-17 DIAGNOSIS — M79601 Pain in right arm: Secondary | ICD-10-CM

## 2016-07-17 DIAGNOSIS — M79602 Pain in left arm: Secondary | ICD-10-CM

## 2016-07-20 ENCOUNTER — Other Ambulatory Visit: Payer: Self-pay

## 2016-07-20 DIAGNOSIS — M489 Spondylopathy, unspecified: Secondary | ICD-10-CM

## 2016-08-02 DIAGNOSIS — G5602 Carpal tunnel syndrome, left upper limb: Secondary | ICD-10-CM | POA: Diagnosis not present

## 2016-08-02 DIAGNOSIS — G5621 Lesion of ulnar nerve, right upper limb: Secondary | ICD-10-CM | POA: Diagnosis not present

## 2016-08-02 DIAGNOSIS — G5622 Lesion of ulnar nerve, left upper limb: Secondary | ICD-10-CM | POA: Diagnosis not present

## 2016-08-02 DIAGNOSIS — G5601 Carpal tunnel syndrome, right upper limb: Secondary | ICD-10-CM | POA: Diagnosis not present

## 2016-08-11 ENCOUNTER — Ambulatory Visit: Payer: Medicaid Other | Admitting: Physician Assistant

## 2016-08-30 ENCOUNTER — Ambulatory Visit: Payer: Medicaid Other | Admitting: Physician Assistant

## 2016-09-08 ENCOUNTER — Other Ambulatory Visit: Payer: Self-pay | Admitting: Neurosurgery

## 2016-09-09 ENCOUNTER — Encounter: Payer: Self-pay | Admitting: Physician Assistant

## 2016-09-09 ENCOUNTER — Ambulatory Visit (INDEPENDENT_AMBULATORY_CARE_PROVIDER_SITE_OTHER): Payer: Medicaid Other | Admitting: Physician Assistant

## 2016-09-09 VITALS — BP 150/90 | HR 74 | Temp 97.6°F | Resp 16 | Wt 251.6 lb

## 2016-09-09 DIAGNOSIS — F17200 Nicotine dependence, unspecified, uncomplicated: Secondary | ICD-10-CM | POA: Diagnosis not present

## 2016-09-09 DIAGNOSIS — M489 Spondylopathy, unspecified: Secondary | ICD-10-CM | POA: Diagnosis not present

## 2016-09-09 DIAGNOSIS — G4700 Insomnia, unspecified: Secondary | ICD-10-CM

## 2016-09-09 MED ORDER — ZOLPIDEM TARTRATE 10 MG PO TABS: 10.0000 mg | ORAL_TABLET | Freq: Every evening | ORAL | 2 refills | Status: DC | PRN

## 2016-09-09 MED ORDER — BUPROPION HCL ER (SR) 150 MG PO TB12: ORAL_TABLET | ORAL | 5 refills | Status: DC

## 2016-09-09 NOTE — Progress Notes (Signed)
Patient ID: Rick Kim MRN: [id number], DOB: [birth year] 46 y.o. Date of Encounter: 09/09/2016, 8:11 AM    Chief Complaint: 1 week f/u OV to f/u bilateral arm paresthesias, insomnia  HPI: 46 y.o. y/o male presents with above.    04/22/2016:  here for CPE.   He had OV 07/2012 for complete physical exam with Dr. Dennard Schaumann. He had CPE with me 03/06/2014. He said that prior to that he come here off and on for many many years.  Today he does have a couple of issues to report/discuss.  States that he has been having problems with pain and paresthesias down his arms. He says that he feels it all the way down the entire arm. Says that he feels a throbbing discomfort and also a feeling as if they have "fallen asleep ". Says that if it happens during the daytime then he is able to shake out his arms and move them around. Says that at night he will wake up and they just feel sore. Says that sometimes when he is "gripping the steering wheel and also when he is drawing or painting --at these times his entire arms bilaterally will feel numb. Asked if he has felt any neck pain. 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 Has not been dropping any tools etc.  He also says that he is getting "no sleep"--says that his "mind won't shut off". So, he feels like he has absolutely no energy and feels exhausted. Says that he will get in bed but then just stares at the wall etc. and can't sleep. Says that recently he has just been falling asleep in the recliner watching TV. Even so he still doesn't fall asleep until around 1 or 2 in the morning. Then he says that if anything at all  wakes him up, then he can't go back to sleep. "If the dog barks then it's all over." Asked how often this happens but he says that usually will have to wake up to go to the bathroom or something and then he never can get back to sleep. He does not take any naps during the day.  Also discussed that at his physical 02/2014 I had ordered a chest x-ray but he did not have it done. Says he was unable to get a ride at that time but that he does want to get that chest x-ray. Noted that I had prescribed Wellbutrin for smoking cessation and he says he doesn't think he ever used it. Is currently smoking 1 pack per day.  No other complaints or concerns.  AT THAT VISIT:  --Updated Preventive Care --Ordered CXR --Ordered Ambien 10mg  QHS --Ordered XRay Cervical Spine  CBC TSH CMET were all normal. Lipid panel was skipped because he was not fasting at that visit and he had a lipid panel just a couple years ago that was WNL  Chest x-ray was performed and showed findings consistent with COPD, smoking history-- otherwise was normal.  X-ray cervical spine did show : "FINDINGS: The cervical vertebral bodies are preserved in height. There is mild disc  space narrowing at C3-4. The prevertebral soft tissue spaces are normal. There is no perched facet. There is mild bony encroachment upon the neural foramina at the C3-4 level bilaterally. The odontoid is intact.  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 Mild bony encroachment upon the neural foramen bilaterally at this level. Given the patient's symptoms, cervical spine MRI would be a useful next imaging step."   When I obtained x-ray results of cervical spine, I recommended ordering MRI cervical spine. However at this time Medicaid is not approving this. Therefore I had planned for him to treat with prednisone taper and then have follow-up office visit so that we can have further documentation in order to get the MRI report approved.  When we  called patient with chest x-ray results, he requested prescription for Chantix for smoking cessation. At that time I recommended that he wait to discuss this at today's visit.   04/29/2016: Today he reports that the Ambien has been working well. At last visit he reported not falling asleep until around 1:30 or 2 AM. Now he says that he takes the Ambien around 9 PM and is asleep by 945. He has been getting good sleep until he may be awoken with something such as needing to go to the bathroom or his kids etc. Says that he has 104-year-old twins-- one is a boy one is a girl. The Ambien is causing no adverse effects. Says that he feels much better since he has been getting more sleep.  He does want to take a medicine to help him quit smoking. He did use Chantix in the past and it "made him feel weird" and he quit it. Therefore today I have recommended that we use Wellbutrin instead. He is agreeable with this.  Today I have discussed the results of the cervical spine x-ray. I have also discussed the fact that Medicaid would not cover his MRI at this point. Says that we need to have a trial of medication and follow-up visit and then try to order the MRI again. And then having follow-up visit and he is agreeable with this approach. States that those symptoms in his arm are stable and the same as at last visit. Developing no weakness in his arms or hands.  AT THAT OV: Continued Ambien 10mg  Prescribed Wellbutrin for Smoking Cessation, Gave handout with tips for cessation Prescribed Prednisone taper-- for Paresthesias, Pain in Arms, Cervical Disc Disease   05/13/2016: Today he reports that the Ambien continues to work well. Continuing to get much better sleep. He reports that he is taking the Wellbutrin. It is causing no adverse effects. That it is working. He has decreased smoking to just very few cigarettes. Is hoping he can completely quit in the very near future. He reports that he took the prednisone  taper as directed. However, noticed absolutely no change in his symptoms. Has continued to wake up at night and has to "shake his arms out". Continuing to have paresthesias down both arms. Even while taking the prednisone, the symptoms never decreased at all.   06/21/2016: At [place] 05/13/16 I placed another order for MRI cervical spine.  Medicaid denied it again ---05/20/2016---sent in prescription for Flexeril 10 mg 1 by mouth 3 times a day when necessary and cautioned regarding drowsiness. Medicaid paperwork states that patient has to have a failure to improve after a 6 week trial of physician - guided clinical care (treatment or observation) and clinical reevaluation. Today patient states that he is taking  the Flexeril every night but cannot take it during the day because it does make him drowsy and cannot take it with his work. Is taking it every night. Has not noticing a big difference in symptoms. Asked if he is dropping any objects because of decreased strength and decreased grip. Says that if he feels his hand getting numb, then he will go ahead and set down an object and shake his hands and arms out and move his arms and does this before it gets to that point of dropping an object. Still having weakness paresthesias pain in both arms and hands.  09/09/2016: He is seeing Dr. Cyndy Freeze regarding his paresthesias in his arms and hands. He says that he did not think that the bone spur in his neck was causing his symptoms. Says that he did nerve conduction studies that showed a lot of nerve damage in his arms. He is scheduled for surgery to his left hand with Dr. Cyndy Freeze on May 10. Says that "there is a lot of nerve damage in there". Says that he will definitely have to have surgery on his right hand in the future but needs to do one at the time. Says the Ambien has continued to work and just needs refills on this. Says that the Wellbutrin is "helping me tremendously ". Has decreased from 1-1/2 packs a day down to  three fourths packs a day. Says that he thinks he is going to just have to put a certain number of cigarettes and is a ziplock bag and know that that is all that he has to last the day and just force himself to cut back further.Also says that sometimes when he wants a cigarette, gets a peppermint candy instead--and that helps. Has no other concerns to address today.     Review of Systems: Consitutional: No fever, chills, fatigue, night sweats, lymphadenopathy, or weight changes. Eyes: No visual changes, eye redness, or discharge. ENT/Mouth: Ears: No otalgia, tinnitus, hearing loss, discharge. Nose: No congestion, rhinorrhea, sinus pain, or epistaxis. Throat: No sore throat, post nasal drip, or teeth pain. Cardiovascular: No CP, palpitations, diaphoresis, DOE, edema, orthopnea, PND. Respiratory: No cough, hemoptysis, SOB, or wheezing. Gastrointestinal: No anorexia, dysphagia, reflux, pain, nausea, vomiting, hematemesis, diarrhea, constipation, BRBPR, or melena. Genitourinary: No dysuria, frequency, urgency, hematuria, incontinence, nocturia, decreased urinary stream, discharge, impotence, or testicular pain/masses. Musculoskeletal: No decreased ROM, myalgias, stiffness, joint swelling, or weakness. Skin: No rash, erythema, lesion changes, pain, warmth, jaundice, or pruritis. Neurological: No headache, dizziness, syncope, seizures, tremors, memory loss, coordination problems, or paresthesias. Psychological: No anxiety, depression, hallucinations, SI/HI. Endocrine: No fatigue, polydipsia, polyphagia, polyuria, or known diabetes. All other systems were reviewed and are otherwise negative.  Past Medical History:  Diagnosis Date  . Disorder of lumbar spine   . Smoker   He says  that he has had problems with discs in his lumbar spine. Says he has had injections done to the area --at [facility]-- in the past.  No past surgical history on file.  Home Meds:  None  Allergies: No  Known Allergies  Social History   Social History  . Marital status: Divorced    Spouse name: N/A  . Number of children: N/A  . Years of education: N/A   Occupational History  . Not on file.   Social History Main Topics  . Smoking status: Current Every Day Smoker    Packs/day: 1.00    Types: Cigarettes  . Smokeless tobacco: Never Used  . Alcohol  use Not on file     Comment: socially  . Drug use: No  . Sexual activity: Not on file   Other Topics Concern  . Not on file   Social History Narrative   Entered 2015:   Married.   4 kids -- Ages 65 y/o, 103 y/o, Twins 18 y/o   2 older kids from different marriage.    Twins from new marriage --this wife had never had kids until these twins.   Works on [employer].   He walks around neighborhood routinely and rides bike.          Family History  Problem Relation Age of Onset  . Cancer Sister 36    Lung Cancer  . Alcohol abuse Father     Physical Exam: Blood pressure (!) 150/90, pulse 74, temperature 97.6 F (36.4 C), temperature source Oral, resp. rate 16, weight 251 lb 9.6 oz (114.1 kg), SpO2 98 %.  General: Well developed, well nourished, WM. Appears in no acute distress. Neck: Supple. Trachea midline. No thyromegaly.  No lymphadenopathy. Lungs: Clear to auscultation bilaterally without wheezes, rales, or rhonchi. Breathing is of normal effort and unlabored. Cardiovascular: RRR with S1 S2. No murmurs, rubs, or gallops. Distal pulses 2+ symmetrically. No carotid or abdominal bruits. Abdomen: Soft, non-tender, non-distended with normoactive bowel sounds. No hepatosplenomegaly or masses. No rebound/guarding. No CVA tenderness. No hernias.  Musculoskeletal: Full range of motion and 5/5 strength throughout. Without swelling, atrophy, tenderness, crepitus, or warmth.  He does have tenderness with palpation along both sides of his neck and down towards the scapula covering the entire area of the trapezius etc. He has pretty good range of  motion of his neck. Also had him perform range of motion of the shoulders and he has full range of motion but when he abducts does say that that causes pain up in the tops of the shoulders. He has strong 5/5 grip strength and upper extremity strength bilaterally. Skin: He has lots of tattoos-- on arms, chest, back   Warm and moist without erythema, ecchymosis, wounds, or rash. Neuro: A+Ox3. CN II-XII grossly intact. Moves all extremities spontaneously. Full sensation throughout. Normal gait. Psych:  Responds to questions appropriately with a normal affect.   Assessment/Plan:  46 y.o. y/o white male here for     1. Cervical spine disease 2. 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 Christella Noa. He is scheduled for surgery to the left hand with Dr. Cyndy Freeze on May 10. Will need future surgery to the right hand.  2. Smoker 09/09/2016: He will continue the Wellbutrin SR 150 mg twice a day.        He will continue to decrease # of cigarettes  3. Insomnia, unspecified type 09/09/2016: He will continue Ambien 10 mg daily at bedtime.  He will schedule routine follow-up visit with me in 6 months. F/U sooner if needed.    THE FOLLOWING IS COPIED FROM PREVENTIVE CARE NOTE 04/22/2016---NOT ADDRESSED AT F/U [facility]  Visit for preventive health examination  A. Screening Labs: He is not fasting today. Reviewed his lipid panel from 02/2014 was okay so we can just skip rechecking lipid panel today and go ahead and check other labs while he is here. - CBC with Differential - COMPLETE METABOLIC PANEL WITH GFR - TSH  B. Screening For Prostate Cancer:  Not indicated until age 2  C. Screening For Colorectal Cancer:   Not indicated until age 26  D. Immunizations: Flu--- patient  is agreeable to receive influenza vaccine here today. Tetanus-- Tdap--Given here 03/06/2014 Pneumococcal--- given that he is a smoker he does need pneumonia vaccine. --Pneumovax 23---Given here  04/22/2016.   -- Further Pneumonia Vaccine not due until age 68 Zostavax----not indicated until age 77.      Signed:   [address] [place], [place]  09/09/2016 8:11 AM

## 2016-09-23 ENCOUNTER — Ambulatory Visit (HOSPITAL_COMMUNITY)
Admission: RE | Admit: 2016-09-23 | Discharge: 2016-09-23 | Disposition: A | Discharge status: Home / Self Care | Payer: Medicaid Other | Source: Ambulatory Visit | Attending: Neurosurgery | Admitting: Neurosurgery

## 2016-09-23 ENCOUNTER — Ambulatory Visit (HOSPITAL_COMMUNITY): Payer: Medicaid Other | Admitting: Anesthesiology

## 2016-09-23 ENCOUNTER — Encounter (HOSPITAL_COMMUNITY)
Admission: RE | Disposition: A | Discharge status: Home / Self Care | Payer: Self-pay | Source: Ambulatory Visit | Attending: Neurosurgery

## 2016-09-23 ENCOUNTER — Encounter (HOSPITAL_COMMUNITY): Payer: Self-pay | Admitting: General Practice

## 2016-09-23 DIAGNOSIS — G709 Myoneural disorder, unspecified: Secondary | ICD-10-CM | POA: Insufficient documentation

## 2016-09-23 DIAGNOSIS — Z811 Family history of alcohol abuse and dependence: Secondary | ICD-10-CM | POA: Diagnosis not present

## 2016-09-23 DIAGNOSIS — Z801 Family history of malignant neoplasm of trachea, bronchus and lung: Secondary | ICD-10-CM | POA: Diagnosis not present

## 2016-09-23 DIAGNOSIS — G5603 Carpal tunnel syndrome, bilateral upper limbs: Secondary | ICD-10-CM | POA: Diagnosis not present

## 2016-09-23 DIAGNOSIS — M2578 Osteophyte, vertebrae: Secondary | ICD-10-CM | POA: Diagnosis not present

## 2016-09-23 DIAGNOSIS — Z79899 Other long term (current) drug therapy: Secondary | ICD-10-CM | POA: Insufficient documentation

## 2016-09-23 DIAGNOSIS — F17200 Nicotine dependence, unspecified, uncomplicated: Secondary | ICD-10-CM | POA: Insufficient documentation

## 2016-09-23 HISTORY — PX: CARPAL TUNNEL RELEASE: SHX101

## 2016-09-23 HISTORY — DX: Myoneural disorder, unspecified: G70.9

## 2016-09-23 HISTORY — DX: Anxiety disorder, unspecified: F41.9

## 2016-09-23 HISTORY — DX: Depression, unspecified: F32.A

## 2016-09-23 HISTORY — DX: Major depressive disorder, single episode, unspecified: F32.9

## 2016-09-23 LAB — CBC
HCT: 45.3 % (ref 39.0–52.0)
Hemoglobin: 15.5 g/dL (ref 13.0–17.0)
MCH: 31.6 pg (ref 26.0–34.0)
MCHC: 34.2 g/dL (ref 30.0–36.0)
MCV: 92.3 fL (ref 78.0–100.0)
PLATELETS: 227 10*3/uL (ref 150–400)
RBC: 4.91 MIL/uL (ref 4.22–5.81)
RDW: 13.1 % (ref 11.5–15.5)
WBC: 5.9 10*3/uL (ref 4.0–10.5)

## 2016-09-23 LAB — BASIC METABOLIC PANEL
Anion gap: 9 (ref 5–15)
BUN: 10 mg/dL (ref 6–20)
CALCIUM: 9.2 mg/dL (ref 8.9–10.3)
CO2: 24 mmol/L (ref 22–32)
Chloride: 106 mmol/L (ref 101–111)
Creatinine, Ser: 1.08 mg/dL (ref 0.61–1.24)
GFR calc Af Amer: >60 mL/min (ref >60)
GLUCOSE: 84 mg/dL (ref 65–99)
Potassium: 3.9 mmol/L (ref 3.5–5.1)
Sodium: 139 mmol/L (ref 135–145)

## 2016-09-23 SURGERY — CARPAL TUNNEL RELEASE
Anesthesia: Monitor Anesthesia Care | Site: Wrist | Laterality: Left

## 2016-09-23 MED ORDER — MIDAZOLAM HCL 5 MG/5ML IJ SOLN
INTRAMUSCULAR | Status: DC | PRN
  Administered 2016-09-23: 2 mg via INTRAVENOUS

## 2016-09-23 MED ORDER — CHLORHEXIDINE GLUCONATE CLOTH 2 % EX PADS: 6.0000 | MEDICATED_PAD | Freq: Once | CUTANEOUS | Status: DC

## 2016-09-23 MED ORDER — LIDOCAINE-EPINEPHRINE 1 %-1:100000 IJ SOLN
INTRAMUSCULAR | Status: DC | PRN
  Administered 2016-09-23: 10 mL

## 2016-09-23 MED ORDER — CEFAZOLIN SODIUM-DEXTROSE 2-4 GM/100ML-% IV SOLN
2.0000 g | INTRAVENOUS | Status: AC
  Administered 2016-09-23: 2 g via INTRAVENOUS
  Filled 2016-09-23: qty 100

## 2016-09-23 MED ORDER — LACTATED RINGERS IV SOLN
INTRAVENOUS | Status: DC | PRN
  Administered 2016-09-23: 12:00:00 via INTRAVENOUS

## 2016-09-23 MED ORDER — MIDAZOLAM HCL 2 MG/2ML IJ SOLN
INTRAMUSCULAR | Status: AC
  Filled 2016-09-23: qty 2

## 2016-09-23 MED ORDER — PROPOFOL 500 MG/50ML IV EMUL
INTRAVENOUS | Status: DC | PRN
  Administered 2016-09-23: 75 ug/kg/min via INTRAVENOUS

## 2016-09-23 MED ORDER — FENTANYL CITRATE (PF) 100 MCG/2ML IJ SOLN: 25.0000 ug | INTRAMUSCULAR | Status: DC | PRN

## 2016-09-23 MED ORDER — LIDOCAINE HCL (CARDIAC) 20 MG/ML IV SOLN
INTRAVENOUS | Status: DC | PRN
  Administered 2016-09-23: 60 mg via INTRATRACHEAL

## 2016-09-23 MED ORDER — FENTANYL CITRATE (PF) 250 MCG/5ML IJ SOLN
INTRAMUSCULAR | Status: AC
  Filled 2016-09-23: qty 5

## 2016-09-23 MED ORDER — DIPHENHYDRAMINE HCL 50 MG/ML IJ SOLN
INTRAMUSCULAR | Status: DC | PRN
  Administered 2016-09-23: 25 mg via INTRAVENOUS

## 2016-09-23 MED ORDER — FENTANYL CITRATE (PF) 250 MCG/5ML IJ SOLN
INTRAMUSCULAR | Status: DC | PRN
  Administered 2016-09-23 (×2): 50 ug via INTRAVENOUS

## 2016-09-23 MED ORDER — BACITRACIN ZINC 500 UNIT/GM EX OINT
TOPICAL_OINTMENT | CUTANEOUS | Status: AC
  Filled 2016-09-23: qty 28.35

## 2016-09-23 MED ORDER — ACETAMINOPHEN-CODEINE #3 300-30 MG PO TABS: 1.0000 | ORAL_TABLET | Freq: Four times a day (QID) | ORAL | 0 refills | Status: DC | PRN

## 2016-09-23 MED ORDER — 0.9 % SODIUM CHLORIDE (POUR BTL) OPTIME
TOPICAL | Status: DC | PRN
  Administered 2016-09-23: 1000 mL

## 2016-09-23 MED ORDER — BACITRACIN ZINC 500 UNIT/GM EX OINT
TOPICAL_OINTMENT | CUTANEOUS | Status: DC | PRN
  Administered 2016-09-23: 1 via TOPICAL

## 2016-09-23 MED ORDER — LIDOCAINE-EPINEPHRINE 1 %-1:100000 IJ SOLN
INTRAMUSCULAR | Status: AC
  Filled 2016-09-23: qty 1

## 2016-09-23 SURGICAL SUPPLY — 64 items
ADH SKN CLS APL DERMABOND .7 (GAUZE/BANDAGES/DRESSINGS)
BANDAGE ACE 3X5.8 VEL STRL LF (GAUZE/BANDAGES/DRESSINGS) ×3 IMPLANT
BLADE SURG 15 STRL LF DISP TIS (BLADE) ×1 IMPLANT
BLADE SURG 15 STRL SS (BLADE) ×3
BNDG GAUZE ELAST 4 BULKY (GAUZE/BANDAGES/DRESSINGS) ×2 IMPLANT
CARTRIDGE OIL MAESTRO DRILL (MISCELLANEOUS) ×1 IMPLANT
CORDS BIPOLAR (ELECTRODE) ×3 IMPLANT
DECANTER SPIKE VIAL GLASS SM (MISCELLANEOUS) ×3 IMPLANT
DERMABOND ADVANCED (GAUZE/BANDAGES/DRESSINGS)
DERMABOND ADVANCED .7 DNX12 (GAUZE/BANDAGES/DRESSINGS) IMPLANT
DIFFUSER DRILL AIR PNEUMATIC (MISCELLANEOUS) ×1 IMPLANT
DRAPE EXTREMITY T 121X128X90 (DRAPE) ×3 IMPLANT
DRAPE HALF SHEET 40X57 (DRAPES) ×3 IMPLANT
DURAPREP 26ML APPLICATOR (WOUND CARE) ×3 IMPLANT
GAUZE SPONGE 4X4 12PLY STRL (GAUZE/BANDAGES/DRESSINGS) ×3 IMPLANT
GAUZE SPONGE 4X4 16PLY XRAY LF (GAUZE/BANDAGES/DRESSINGS) ×3 IMPLANT
GLOVE BIO SURGEON STRL SZ 6.5 (GLOVE) IMPLANT
GLOVE BIO SURGEON STRL SZ7 (GLOVE) IMPLANT
GLOVE BIO SURGEON STRL SZ7.5 (GLOVE) IMPLANT
GLOVE BIO SURGEON STRL SZ8 (GLOVE) IMPLANT
GLOVE BIO SURGEON STRL SZ8.5 (GLOVE) IMPLANT
GLOVE BIO SURGEONS STRL SZ 6.5 (GLOVE)
GLOVE BIOGEL M 8.0 STRL (GLOVE) IMPLANT
GLOVE BIOGEL PI IND STRL 7.5 (GLOVE) IMPLANT
GLOVE BIOGEL PI INDICATOR 7.5 (GLOVE) ×4
GLOVE ECLIPSE 6.5 STRL STRAW (GLOVE) ×3 IMPLANT
GLOVE ECLIPSE 7.0 STRL STRAW (GLOVE) IMPLANT
GLOVE ECLIPSE 7.5 STRL STRAW (GLOVE) IMPLANT
GLOVE ECLIPSE 8.0 STRL XLNG CF (GLOVE) IMPLANT
GLOVE ECLIPSE 8.5 STRL (GLOVE) IMPLANT
GLOVE EXAM NITRILE LRG STRL (GLOVE) IMPLANT
GLOVE EXAM NITRILE XL STR (GLOVE) IMPLANT
GLOVE EXAM NITRILE XS STR PU (GLOVE) IMPLANT
GLOVE INDICATOR 6.5 STRL GRN (GLOVE) IMPLANT
GLOVE INDICATOR 7.0 STRL GRN (GLOVE) IMPLANT
GLOVE INDICATOR 7.5 STRL GRN (GLOVE) IMPLANT
GLOVE INDICATOR 8.0 STRL GRN (GLOVE) IMPLANT
GLOVE INDICATOR 8.5 STRL (GLOVE) IMPLANT
GLOVE OPTIFIT SS 8.0 STRL (GLOVE) IMPLANT
GLOVE SURG SS PI 6.5 STRL IVOR (GLOVE) IMPLANT
GLOVE SURG SS PI 7.0 STRL IVOR (GLOVE) ×4 IMPLANT
GOWN STRL REUS W/ TWL LRG LVL3 (GOWN DISPOSABLE) ×2 IMPLANT
GOWN STRL REUS W/ TWL XL LVL3 (GOWN DISPOSABLE) IMPLANT
GOWN STRL REUS W/TWL 2XL LVL3 (GOWN DISPOSABLE) IMPLANT
GOWN STRL REUS W/TWL LRG LVL3 (GOWN DISPOSABLE) ×9
GOWN STRL REUS W/TWL XL LVL3 (GOWN DISPOSABLE)
KIT BASIN OR (CUSTOM PROCEDURE TRAY) ×3 IMPLANT
KIT ROOM TURNOVER OR (KITS) ×3 IMPLANT
NDL HYPO 25X1 1.5 SAFETY (NEEDLE) ×1 IMPLANT
NEEDLE HYPO 25X1 1.5 SAFETY (NEEDLE) ×3 IMPLANT
NS IRRIG 1000ML POUR BTL (IV SOLUTION) ×3 IMPLANT
OIL CARTRIDGE MAESTRO DRILL (MISCELLANEOUS)
PACK SURGICAL SETUP 50X90 (CUSTOM PROCEDURE TRAY) ×3 IMPLANT
PAD ARMBOARD 7.5X6 YLW CONV (MISCELLANEOUS) ×9 IMPLANT
STOCKINETTE 4X48 STRL (DRAPES) ×3 IMPLANT
SUT ETHILON 3 0 PS 1 (SUTURE) ×3 IMPLANT
SYR BULB 3OZ (MISCELLANEOUS) ×3 IMPLANT
SYR CONTROL 10ML LL (SYRINGE) ×3 IMPLANT
TOWEL GREEN STERILE (TOWEL DISPOSABLE) ×3 IMPLANT
TOWEL GREEN STERILE FF (TOWEL DISPOSABLE) ×1 IMPLANT
TUBE CONNECTING 12'X1/4 (SUCTIONS) ×1
TUBE CONNECTING 12X1/4 (SUCTIONS) ×2 IMPLANT
UNDERPAD 30X30 (UNDERPADS AND DIAPERS) ×3 IMPLANT
WATER STERILE IRR 1000ML POUR (IV SOLUTION) ×3 IMPLANT

## 2016-09-23 NOTE — Progress Notes (Signed)
Patient states that he will arrive at 1030 and has had nothing to eat or drink since midnight.

## 2016-09-23 NOTE — Transfer of Care (Signed)
Immediate Anesthesia Transfer of Care Note  Patient: ROE WILNER  Procedure(s) Performed: Procedure(s) with comments: CARPAL TUNNEL RELEASE (Left) - left  Patient Location: PACU  Anesthesia Type:MAC  Level of Consciousness: awake, alert  and oriented  Airway & Oxygen Therapy: Patient Spontanous Breathing  Post-op Assessment: Report given to RN, Post -op Vital signs reviewed and stable and Patient moving all extremities X 4  Post vital signs: Reviewed and stable  Last Vitals:  Vitals:   09/23/16 1044 09/23/16 1410  BP: (!) 145/87 (!) 156/84  Pulse: 70 70  Resp: 18 15  Temp: 36.6 C 36.6 C    Last Pain:  Vitals:   09/23/16 1044  TempSrc: Oral      Patients Stated Pain Goal: 4 (93/57/01 7793)  Complications: No apparent anesthesia complications

## 2016-09-23 NOTE — Anesthesia Preprocedure Evaluation (Signed)
Anesthesia Evaluation  Patient identified by MRN, date of birth, ID band Patient awake    Reviewed: Allergy & Precautions, NPO status , Patient's Chart, lab work & pertinent test results  Airway Mallampati: II  TM Distance: >3 FB     Dental   Pulmonary Current Smoker,    breath sounds clear to auscultation       Cardiovascular negative cardio ROS   Rhythm:Regular Rate:Normal     Neuro/Psych  Neuromuscular disease    GI/Hepatic Neg liver ROS,   Endo/Other  negative endocrine ROS  Renal/GU negative Renal ROS     Musculoskeletal   Abdominal   Peds  Hematology   Anesthesia Other Findings   Reproductive/Obstetrics                             Anesthesia Physical Anesthesia Plan  ASA: III  Anesthesia Plan: MAC   Post-op Pain Management:    Induction: Intravenous  Airway Management Planned: Simple Face Mask  Additional Equipment:   Intra-op Plan:   Post-operative Plan:   Informed Consent: I have reviewed the patients History and Physical, chart, labs and discussed the procedure including the risks, benefits and alternatives for the proposed anesthesia with the patient or authorized representative who has indicated his/her understanding and acceptance.   Dental advisory given  Plan Discussed with: CRNA and Anesthesiologist  Anesthesia Plan Comments:         Anesthesia Quick Evaluation

## 2016-09-23 NOTE — Anesthesia Postprocedure Evaluation (Signed)
Anesthesia Post Note  Patient: Rick Kim  Procedure(s) Performed: Procedure(s) (LRB): CARPAL TUNNEL RELEASE (Left)  Patient location during evaluation: PACU Anesthesia Type: MAC Level of consciousness: awake Pain management: pain level controlled Vital Signs Assessment: post-procedure vital signs reviewed and stable Respiratory status: spontaneous breathing Cardiovascular status: stable Anesthetic complications: no       Last Vitals:  Vitals:   09/23/16 1425 09/23/16 1440  BP: (!) 152/88 (!) 158/85  Pulse: (!) 59 60  Resp: 13 14  Temp:      Last Pain:  Vitals:   09/23/16 1044  TempSrc: Oral                 [name]

## 2016-09-23 NOTE — Discharge Instructions (Signed)

## 2016-09-23 NOTE — Op Note (Signed)
   2:06 PM  PATIENT:  Rick Kim  46 y.o. male with left hand pain and emg proven carpal tunnel syndrome.  PRE-OPERATIVE DIAGNOSIS:  left Carpal Tunnel Syndrome  POST-OPERATIVE DIAGNOSIS:  Left Carpal Tunnel Syndrome  PROCEDURE:  Procedure(s): Left CARPAL TUNNEL RELEASE  SURGEON: Surgeon(s): Ashok Pall, MD  ANESTHESIA:   local and IV sedation  EBL:  Total I/O In: 800 [I.V.:800] Out: -   COUNT:per nursing  DICTATION: AYDDEN CUMPIAN was taken to the operating room, given IV sedation, and positioned on the operating room table. male had their left upper extremity prepped and draped in a sterile manner. I infiltrated Licodcaine  0/2% 2/336,122 strength epinephrine into the planned incision starting at the proximal palmar crease extending into the hand ~ 1.5cm. I opened the skin with a 15 blade and extended the incision through the skin into the subcutaneous tissue. I used the bipolar cautery to control the subcutaneous bleeding. I dissected sharply through the tissue using forceps also to expose the transverse carpal ligament. I dived the transverse carpal ligament sharply with the 15 blade using the forceps to protect the contents of the carpal tunnel. With the scissors I divided the ligament both proximally and distally to decompress the entire carpal tunnel. I used the scissors to dissect into the forearm to create space to divide the ligament to the proximal palmar crease, and distally into the palm.  I irrigated the wound then closed the incision with vertical interrupted vertical mattress sutures. I placed a sterile dressing, then wrapped the proximal hand and distal forearm with an ace wrap.  PLAN OF CARE: Discharge to home after PACU  PATIENT DISPOSITION:  PACU - hemodynamically stable.   Delay start of Pharmacological VTE agent (>24hrs) due to surgical blood loss or risk of bleeding:  yes

## 2016-09-23 NOTE — H&P (Signed)
Rick Kim is a 46 year old gentleman who presents today for evaluation of pain and numbness that he has in both upper extremities. He says this has been going on about a year and has gotten worse. He does describe some weakness in his arms, numbness and tingling in his arms. Says pain is 8/10. SOCIAL HISTORY: He is right handed. Does smoke. Does body work on cars. Does drink alcohol socially. No history of substance abuse. PAST MEDICAL HISTORY: Excellent. No medications. No drug allergies. No previous operations. FAMILY HISTORY: His mother is 73 and in good health. Father died at age 6. REVIEW OF SYSTEMS: Otherwise good. PHYSICAL EXAMINATION: Vital Signs: Height 6 feet 0 inches, weight 253.8 pounds, temperature is 97.7, blood pressure is 150/83, pulse 76, pain is 8/10. General: He is alert, oriented x4, and answers all questions appropriately. Neuro: Memory, language, attention span, and fund of knowledge are normal. Speech is clear; it is also fluent. Hearing intact to voice. Uvula elevates midline. Shoulder shrug normal. Tongue protrudes in the midline. Positive Tinel sign over the cubital tunnels bilaterally and carpal tunnels bilaterally. 2+ reflexes, biceps, triceps, brachioradialis, knees, and ankles. 5/5 strength. Gait is normal. Romberg is negative. Proprioception is intact. IMAGING: MRI of the cervical spine is reviewed. Shows a large osteophyte at C3-4 eccentric to the left side. Cord signal is normal. No other significant abnormalities identified. No cause for the bilateral tingling and numbness that he has. DIAGNOSIS: Bilateral carpal tunnel syndrome, bilateral ulnar nerve neuropathy.

## 2016-09-24 ENCOUNTER — Encounter (HOSPITAL_COMMUNITY): Payer: Self-pay | Admitting: Neurosurgery

## 2017-04-16 ENCOUNTER — Other Ambulatory Visit: Payer: Self-pay | Admitting: Physician Assistant

## 2017-04-16 DIAGNOSIS — G4700 Insomnia, unspecified: Secondary | ICD-10-CM

## 2017-04-18 NOTE — Telephone Encounter (Signed)
Last OV 4/26 Last refill 4/26 Ok to refill?

## 2017-04-18 NOTE — Telephone Encounter (Signed)
Approved. #[id number]. 

## 2017-04-18 NOTE — Telephone Encounter (Signed)
rx called into pharmacy

## 2017-05-30 ENCOUNTER — Other Ambulatory Visit: Payer: Self-pay

## 2017-05-30 ENCOUNTER — Encounter (HOSPITAL_COMMUNITY): Payer: Self-pay

## 2017-05-30 ENCOUNTER — Emergency Department (HOSPITAL_COMMUNITY): Payer: Medicaid Other

## 2017-05-30 ENCOUNTER — Emergency Department (HOSPITAL_COMMUNITY)
Admission: EM | Admit: 2017-05-30 | Discharge: 2017-05-31 | Disposition: A | Discharge status: Home / Self Care | Payer: Medicaid Other | Attending: Emergency Medicine | Admitting: Emergency Medicine

## 2017-05-30 DIAGNOSIS — R251 Tremor, unspecified: Secondary | ICD-10-CM | POA: Insufficient documentation

## 2017-05-30 DIAGNOSIS — R42 Dizziness and giddiness: Secondary | ICD-10-CM | POA: Diagnosis not present

## 2017-05-30 DIAGNOSIS — F17210 Nicotine dependence, cigarettes, uncomplicated: Secondary | ICD-10-CM | POA: Insufficient documentation

## 2017-05-30 DIAGNOSIS — H02402 Unspecified ptosis of left eyelid: Secondary | ICD-10-CM

## 2017-05-30 DIAGNOSIS — R4789 Other speech disturbances: Secondary | ICD-10-CM | POA: Diagnosis present

## 2017-05-30 DIAGNOSIS — R5382 Chronic fatigue, unspecified: Secondary | ICD-10-CM | POA: Diagnosis not present

## 2017-05-30 DIAGNOSIS — R4702 Dysphasia: Secondary | ICD-10-CM | POA: Diagnosis not present

## 2017-05-30 DIAGNOSIS — R5383 Other fatigue: Secondary | ICD-10-CM | POA: Diagnosis not present

## 2017-05-30 LAB — I-STAT TROPONIN, ED: Troponin i, poc: 0 ng/mL (ref 0.00–0.08)

## 2017-05-30 NOTE — ED Notes (Signed)
Patient transported to X-ray 

## 2017-05-30 NOTE — ED Triage Notes (Signed)
Pt arrives to ED from home via EMS with complaints of stroke like symptoms including slurred speech, left sided facial drop, left eye twitch, and difficulty walking since 2100. EMS reports pt had same experience last night, sx lasted approx 30-40 mins and has no sx upon arrival to ED. Pt placed in position of comfort with bed locked and lowered, call bell in reach.

## 2017-05-30 NOTE — ED Provider Notes (Signed)
[facility] EMERGENCY DEPARTMENT Provider Note   CSN: [id number] Arrival date & time: 05/30/17  2143     History   Chief Complaint Chief Complaint  Patient presents with  . Stroke Symptoms    HPI MARUICE PIERONI is a 47 y.o. male with a h/o of anxiety and depression who presents to the emergency department with a chief complaint of slowed speech. The patient's son reports the patient was sitting and reading a magazine at approximately 21:20 when his son noticed that his left eyelid was half-way closed, and his bilateral hands were trembling. The patient's son reports he was able to talk to him during the episode and no slurred speech was noted.  He was answering questions appropriately. His symptoms resolved spontaneously after approximately 20 minutes.  No treatment in route by EMS.  He denies headache, lightheadedness, dizziness, dyspnea, chest pain, tremors, weakness, N/V/D, visual changes, or numbness.  The patient endorses an episode of lightheadedness last night while he was washing dishes.  The patient reports that he felt "as if he might pass out".  He drank a bottle of water and the lightheadedness resolves in approximately 20 minutes. He has no symptoms when he went to bed or when he awoke this morning.   He also notes that he is chronically tired.  He is a current, every day 1 ppd smoker. He denies alcohol use.  He denies IV drug use or recreational drug use.  Home medications include Ambien as needed, which he has not taken for the last 2 days.  The history is provided by the patient. No language interpreter was used.   Past Medical History:  Diagnosis Date  . Anxiety   . Depression   . Disorder of lumbar spine   . Neuromuscular disorder ([place])   . Smoker     Patient Active Problem List   Diagnosis Date Noted  . Insomnia 04/29/2016  . 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 Family history of lung cancer 03/06/2014  . Smoker   . Disorder of lumbar  spine     Past Surgical History:  Procedure Laterality Date  . CARPAL TUNNEL RELEASE Left 09/23/2016   Procedure: CARPAL TUNNEL RELEASE;  Surgeon: Ashok Pall, MD;  Location: [facility];  Service: Neurosurgery;  Laterality: Left;  left  . MULTIPLE TOOTH EXTRACTIONS  2016   under light sedation       Home Medications    Prior to Admission medications   Medication Sig Start Date End Date Taking? Authorizing Provider  acetaminophen-codeine (TYLENOL #3) 300-30 MG tablet Take 1 tablet by mouth every 6 (six) hours as needed for moderate pain. 09/23/16  Yes Ashok Pall, MD  zolpidem (AMBIEN) 10 MG tablet TAKE 1 TABLET BY MOUTH AT BEDTIME AS NEEDED FOR SLEEP 04/18/17  Yes Orlena Sheldon, PA-C  buPROPion [facility] SR) 150 MG 12 hr tablet Take 1 daily for 5 days then take 1 twice a day Patient not taking: Reported on 05/30/2017 09/09/16   Orlena Sheldon, PA-C  cyclobenzaprine (FLEXERIL) 10 MG tablet Take 1 tablet (10 mg total) by mouth 3 (three) times daily as needed for muscle spasms. Patient not taking: Reported on 05/30/2017 05/20/16   Orlena Sheldon, PA-C    Family History Family History  Problem Relation Age of Onset  . Cancer Sister 72       Lung Cancer  . Alcohol abuse Father     Social History Social History   Tobacco Use  .  Smoking status: Current Every Day Smoker    Packs/day: 1.00    Types: Cigarettes  . Smokeless tobacco: Never Used  Substance Use Topics  . Alcohol use: Yes    Comment: socially  . Drug use: No     Allergies   No known allergies   Review of Systems Review of Systems  Constitutional: Negative for chills and fever.  HENT: Negative for congestion and tinnitus.   Eyes: Negative for visual disturbance.  Respiratory: Negative for shortness of breath.   Cardiovascular: Negative for chest pain.  Gastrointestinal: Negative for abdominal pain, diarrhea, nausea and vomiting.  Genitourinary: Negative for difficulty urinating and dysuria.  Musculoskeletal:  Negative for back pain, neck pain and neck stiffness.  Skin: Negative for color change.  Allergic/Immunologic: Negative for immunocompromised state.  Neurological: Positive for speech difficulty (slowed) and light-headedness (resolved). Negative for dizziness, tremors, syncope, weakness, numbness and headaches.  Psychiatric/Behavioral: Negative for confusion.     Physical Exam Updated Vital Signs BP 107/80   Pulse 75   Temp 98.2 F (36.8 C) (Oral)   Resp 15   Ht 6' (1.829 m)   Wt 108.9 kg (240 lb)   SpO2 96%   BMI 32.55 kg/m   Physical Exam  Constitutional: He is oriented to person, place, and time. He appears well-developed. No distress.  HENT:  Head: Normocephalic.  Eyes: Conjunctivae and EOM are normal. Pupils are equal, round, and reactive to light. Right eye exhibits no discharge. Left eye exhibits no discharge. No scleral icterus.  Neck: Normal range of motion. Neck supple.  Cardiovascular: Normal rate, regular rhythm, normal heart sounds and intact distal pulses. Exam reveals no gallop and no friction rub.  No murmur heard. Pulmonary/Chest: Effort normal and breath sounds normal. No stridor. No respiratory distress. He has no wheezes. He has no rales. He exhibits no tenderness.  Abdominal: Soft. Bowel sounds are normal. He exhibits no distension and no mass. There is no tenderness. There is no rebound and no guarding. No hernia.  Musculoskeletal: Normal range of motion. He exhibits no edema, tenderness or deformity.  Neurological: He is alert and oriented to person, place, and time.  GCS 15.  Normal finger to nose.  Cranial nerves II through XII are grossly intact.  5 out of 5 strength against resistance of the bilateral upper and lower extremities.  Sensation is symmetric and intact throughout.  No pronator drift.  Negative Romberg.  Symmetric tandem gait.  No ataxia with heel or toe walking.  Skin: Skin is warm and dry. Capillary refill takes less than 2 seconds. No rash  noted. He is not diaphoretic. No erythema. No pallor.  Psychiatric: His behavior is normal.  Nursing note and vitals reviewed.    ED Treatments / Results  Labs (all labs ordered are listed, but only abnormal results are displayed) Labs Reviewed  RAPID URINE DRUG SCREEN, HOSP PERFORMED - Abnormal; Notable for the following components:      Result Value   Tetrahydrocannabinol POSITIVE (*)    All other components within normal limits  CBC  COMPREHENSIVE METABOLIC PANEL  ETHANOL  URINALYSIS, ROUTINE W REFLEX MICROSCOPIC  ACETYLCHOLINE RECEPTOR AB, ALL  I-STAT TROPONIN, ED    EKG  EKG Interpretation  Date/Time:  Monday May 30 2017 22:14:51 EST Ventricular Rate:  77 PR Interval:    QRS Duration: 125 QT Interval:  416 QTC Calculation: 471 R Axis:   52 Text Interpretation:  Sinus rhythm Ventricular premature complex IVCD, consider atypical RBBB No old  tracing to compare Confirmed by Delora Fuel ([id number]) on 05/30/2017 11:01:13 PM       Radiology Dg Chest 2 View  Result Date: 05/30/2017 CLINICAL DATA:  Short of breath EXAM: CHEST  2 VIEW COMPARISON:  04/22/2016 FINDINGS: Minimal scarring or atelectasis at the lingula. No acute consolidation or effusion. Normal cardiomediastinal silhouette. No pneumothorax. IMPRESSION: No active cardiopulmonary disease. Electronically Signed   By: Donavan Foil M.D.   On: 05/30/2017 23:17   Ct Head Wo Contrast  Result Date: 05/30/2017 CLINICAL DATA:  Near-syncope. Assess TIA. History of anxiety, neuromuscular disorder. EXAM: CT HEAD WITHOUT CONTRAST TECHNIQUE: Contiguous axial images were obtained from the base of the skull through the vertex without intravenous contrast. COMPARISON:  None. FINDINGS: BRAIN: No intraparenchymal hemorrhage, mass effect nor midline shift. The ventricles and sulci are normal. No acute large vascular territory infarcts. No abnormal extra-axial fluid collections. Basal cisterns are patent. VASCULAR: Unremarkable.  SKULL/SOFT TISSUES: No skull fracture. No significant soft tissue swelling. ORBITS/SINUSES: The included ocular globes and orbital contents are normal.Trace paranasal sinus mucosal thickening, bilateral maxillary small mucosal retention cysts. Mastoid air cells are well aerated. OTHER: None. IMPRESSION: Normal noncontrast CT HEAD. Electronically Signed   By: Elon Alas M.D.   On: 05/30/2017 23:45    Procedures Procedures (including critical care time)  Medications Ordered in ED Medications - No data to display   Initial Impression / Assessment and Plan / ED Course  I have reviewed the triage vital signs and the nursing notes.  Pertinent labs & imaging results that were available during my care of the patient were reviewed by me and considered in my medical decision making (see chart for details).     47 year old male with a history of anxiety and depression presenting with an episode of bradyphasia, left eyelid halfway closed, and shaking in the bilateral hands that resolved after about 20 minutes. He was awake and oriented and answering questions appropriately. He had an episode of lightheadedness yesterday that resolved spontaneously.  He currently has no symptoms.  VSS. NAD. Labs are reassuring.  UDS positive for THC.  CT head and CXR are unremarkable.  Discussed the patient with Dr. Vallery Ridge, attending physician. Consulted Dr. Lorraine Lax with neurology who recommended adding on labs for myasthenia gravis given chronic fatigue and eyelid droop and following up with his PCP for a sleep study to assess for OSA.  Went to relay this information and discharge the patient, but nursing staff reports that the patient eloped prior to [facility] lab draw.   Final Clinical Impressions(s) / ED Diagnoses   Final diagnoses:  Drooping eyelid, left  Dysphasia  Chronic fatigue    ED Discharge Orders    None       Joanne Gavel, PA-C 05/31/17 [id number]    Charlesetta Shanks, MD 06/09/17 1318

## 2017-05-31 DIAGNOSIS — H02402 Unspecified ptosis of left eyelid: Secondary | ICD-10-CM

## 2017-05-31 DIAGNOSIS — R5382 Chronic fatigue, unspecified: Secondary | ICD-10-CM

## 2017-05-31 DIAGNOSIS — R4702 Dysphasia: Secondary | ICD-10-CM | POA: Diagnosis not present

## 2017-05-31 LAB — URINALYSIS, ROUTINE W REFLEX MICROSCOPIC
BILIRUBIN URINE: NEGATIVE
Glucose, UA: NEGATIVE mg/dL
HGB URINE DIPSTICK: NEGATIVE
Ketones, ur: NEGATIVE mg/dL
Leukocytes, UA: NEGATIVE
NITRITE: NEGATIVE
Protein, ur: NEGATIVE mg/dL
SPECIFIC GRAVITY, URINE: 1.009 (ref 1.005–1.030)
pH: 6 (ref 5.0–8.0)

## 2017-05-31 LAB — CBC
HCT: 44.5 % (ref 39.0–52.0)
HEMOGLOBIN: 15.1 g/dL (ref 13.0–17.0)
MCH: 32.1 pg (ref 26.0–34.0)
MCHC: 33.9 g/dL (ref 30.0–36.0)
MCV: 94.7 fL (ref 78.0–100.0)
Platelets: 236 10*3/uL (ref 150–400)
RBC: 4.7 MIL/uL (ref 4.22–5.81)
RDW: 12.8 % (ref 11.5–15.5)
WBC: 7.1 10*3/uL (ref 4.0–10.5)

## 2017-05-31 LAB — ETHANOL: Alcohol, Ethyl (B): <10 mg/dL (ref <10)

## 2017-05-31 LAB — COMPREHENSIVE METABOLIC PANEL
ALBUMIN: 3.9 g/dL (ref 3.5–5.0)
ALK PHOS: 95 U/L (ref 38–126)
ALT: 22 U/L (ref 17–63)
AST: 20 U/L (ref 15–41)
Anion gap: 9 (ref 5–15)
BUN: 13 mg/dL (ref 6–20)
CALCIUM: 9.3 mg/dL (ref 8.9–10.3)
CO2: 25 mmol/L (ref 22–32)
CREATININE: 1.23 mg/dL (ref 0.61–1.24)
Chloride: 104 mmol/L (ref 101–111)
GFR calc Af Amer: >60 mL/min (ref >60)
GFR calc non Af Amer: >60 mL/min (ref >60)
Glucose, Bld: 97 mg/dL (ref 65–99)
Potassium: 3.7 mmol/L (ref 3.5–5.1)
SODIUM: 138 mmol/L (ref 135–145)
Total Bilirubin: 0.6 mg/dL (ref 0.3–1.2)
Total Protein: 6.9 g/dL (ref 6.5–8.1)

## 2017-05-31 LAB — RAPID URINE DRUG SCREEN, HOSP PERFORMED
Amphetamines: NOT DETECTED
BARBITURATES: NOT DETECTED
Benzodiazepines: NOT DETECTED
Cocaine: NOT DETECTED
Opiates: NOT DETECTED
TETRAHYDROCANNABINOL: POSITIVE — AB

## 2017-05-31 NOTE — Discharge Instructions (Signed)
The labs we drew at the end of your visit are pending.  You can view the results by downloading the app MyChart on your smartphone.    Please call and schedule a follow-up appointment with your primary care provider in the next week.  You can review the results of these labs with Rick Kim.    Please also ask your primary care provider to schedule a sleep study to make sure your symptoms are not due to obstructive sleep apnea.   If you develop new or worsening symptoms, including a change in her vision, a severe headache, fever, new weakness or numbness, or other new concerning symptoms, please return to the emergency department for reevaluation.

## 2017-05-31 NOTE — Consult Note (Signed)
Neurology Consultation Reason for Consult:Slow speech, left eyelid droop Referring Physician: Joline Maxcy PAC   History is obtained from: Patient, his son and Chart review  HPI: Rick Kim is a 47 y.o. male with past medical history of anxiety, depression, chronic back pain, insomnia who was brought to the ED for slow speech, tremors and left eyelid droop  History was obtained from the patient's son who was the witness as well as the patient himself. History is different from what was provided by EMS. According to his son, the patient was reading a magazine this evening. The son noticed the patient had tremors in both his hands and when he spoke to the patient, speech is very slow. He also notices left eyelid was drooping which which is the first time he saw that. The son denies the speech was slurred, there is no problems with comprehension or expression of language. He assessed father to get up and walk and patient had no problems. This lasted about 20 minutes and EMS was called. The recommended the patient come to the emergency room to rule out acute neurological problem such as TIA.  The patient family deny any problems with speech, balance, vision, arm or leg weakness, numbness, facial droop or headache. Family denies that he lost consciousness. The patient remembers most of the event and what he said to the family. He states that he didn't think anything happen except that he was very tired. He has been tired most evenings. He says he is slept well the night before. He hasn't taken Ambien for 2 days. He denies any illicit drug use however THC was found in his urine drug screen.   His CT head was negative for any acute abnormality. Metabolic workup was negative as well ( except for positive THC.)   ROS: A 14 point ROS was performed and is negative except as noted in the HPI.   Past Medical History:  Diagnosis Date  . Anxiety   . Depression   . Disorder of lumbar spine   .  Neuromuscular disorder ([place])   . Smoker      Family History  Problem Relation Age of Onset  . Cancer Sister 70       Lung Cancer  . Alcohol abuse Father      Social History:  reports that he has been smoking cigarettes.  He has been smoking about 1.00 pack per day. he has never used smokeless tobacco. He reports that he drinks alcohol. He reports that he does not use drugs. UDS positive for THC   Exam: Current vital signs: BP 107/80   Pulse 75   Temp 98.2 F (36.8 C) (Oral)   Resp 15   Ht 6' (1.829 m)   Wt 108.9 kg (240 lb)   SpO2 96%   BMI 32.55 kg/m  Vital signs in last 24 hours: Temp:  [98.2 F (36.8 C)] 98.2 F (36.8 C) (01/14 2215) Pulse Rate:  [60-75] 75 (01/14 2330) Resp:  [14-18] 15 (01/14 2330) BP: (107-143)/(75-89) 107/80 (01/14 2330) SpO2:  [94 %-96 %] 96 % (01/14 2330) Weight:  [108.9 kg (240 lb)] 108.9 kg (240 lb) (01/14 2145)   Physical Exam  Constitutional: Appears well-developed and well-nourished.  Psych: Affect appropriate to situation Eyes: No scleral injection HENT: No OP obstrucion Head: Normocephalic.  Cardiovascular: Normal rate and regular rhythm.  Respiratory: Effort normal, non-labored breathing GI: Soft.  No distension. There is no tenderness.  Skin: WDI  Neuro: Mental Status: Patient is  awake, alert, oriented to person, place, month, year, and situation. Patient is able to give a clear and coherent history. No signs of aphasia or neglect Cranial Nerves: II: Visual Fields are full. Pupils are equal, round, and reactive to light.   III,IV, VI: EOMI without ptosis or diploplia.  V: Facial sensation is symmetric to temperature VII: Facial movement is symmetric.  VIII: hearing is intact to voice X: Uvula elevates symmetrically XI: Shoulder shrug is symmetric. XII: tongue is midline without atrophy or fasciculations.  Motor: Tone is normal. Bulk is normal. 5/5 strength was present in all four extremities. Sensory: Sensation is  symmetric to light touch and temperature in the arms and legs. Deep Tendon Reflexes: 2+ and symmetric in the biceps and patellae.  Plantars: Toes are downgoing bilaterally.  Cerebellar: FNF and HKS are intact bilaterally    I have reviewed labs in epic and the results pertinent to this consultation are: Urine drug screen, CMP, CBC  I have reviewed the images obtained: CT head appears normal.   ASSESSMENT AND PLAN  47 y.o. male with past medical history of anxiety, depression, chronic back pain, insomnia who was brought to the ED for slow speech, tremors and left eyelid droop.  Spell  Unclear as to exact symptomatology/presentation- however does not sound like seizures or stroke/TIA. All BEFAST+H symptoms negative.  Suspect symptoms maybe related to sleep deprivation/obstructive sleep apnea vs cannabis use/Ambien use or withdrawal resulting in hypercarbia vs Myasthenia gravis ( less likely) vs metabolic causes.   Recommend sleep study  LFT and ammonia  F/U with PCP    Karena Addison Jaziyah Gradel MD [facility] [phone]  If 7pm to 7am, please call on call as listed on AMION.

## 2017-05-31 NOTE — ED Notes (Signed)
Patient Alert and oriented X4. Stable and ambulatory. Patient verbalized understanding of the discharge instructions.  Patient belongings were taken by the patient.  

## 2017-05-31 NOTE — ED Notes (Signed)
Patient refused to have labs done here.  Michela Pitcher he will just see is PCP for lab work later.

## 2017-08-23 ENCOUNTER — Other Ambulatory Visit: Payer: Self-pay | Admitting: Orthopedic Surgery

## 2017-08-23 DIAGNOSIS — M5441 Lumbago with sciatica, right side: Secondary | ICD-10-CM

## 2017-08-27 ENCOUNTER — Ambulatory Visit
Admission: RE | Admit: 2017-08-27 | Discharge: 2017-08-27 | Disposition: A | Discharge status: Home / Self Care | Payer: Medicaid Other | Source: Ambulatory Visit | Attending: Orthopedic Surgery | Admitting: Orthopedic Surgery

## 2017-08-27 DIAGNOSIS — M5441 Lumbago with sciatica, right side: Secondary | ICD-10-CM

## 2017-11-10 ENCOUNTER — Other Ambulatory Visit: Payer: Self-pay | Admitting: Physician Assistant

## 2017-11-10 DIAGNOSIS — G4700 Insomnia, unspecified: Secondary | ICD-10-CM

## 2017-11-10 NOTE — Telephone Encounter (Signed)
Last OV 09/09/2017 Last refill 04/18/2017 Ok to refill?

## 2017-12-01 ENCOUNTER — Encounter: Payer: Self-pay | Admitting: Physician Assistant

## 2017-12-01 ENCOUNTER — Ambulatory Visit: Payer: Medicaid Other | Admitting: Physician Assistant

## 2017-12-01 VITALS — BP 122/70 | HR 81 | Temp 97.6°F | Resp 20 | Ht 72.0 in | Wt 250.8 lb

## 2017-12-01 DIAGNOSIS — G5691 Unspecified mononeuropathy of right upper limb: Secondary | ICD-10-CM

## 2017-12-01 DIAGNOSIS — M7582 Other shoulder lesions, left shoulder: Secondary | ICD-10-CM | POA: Diagnosis not present

## 2017-12-01 DIAGNOSIS — M489 Spondylopathy, unspecified: Secondary | ICD-10-CM | POA: Diagnosis not present

## 2017-12-01 DIAGNOSIS — Z1283 Encounter for screening for malignant neoplasm of skin: Secondary | ICD-10-CM | POA: Diagnosis not present

## 2017-12-01 DIAGNOSIS — M778 Other enthesopathies, not elsewhere classified: Secondary | ICD-10-CM

## 2017-12-01 MED ORDER — MELOXICAM 7.5 MG PO TABS: 7.5000 mg | ORAL_TABLET | Freq: Every day | ORAL | 2 refills | Status: AC

## 2017-12-01 MED ORDER — CYCLOBENZAPRINE HCL 10 MG PO TABS: 10.0000 mg | ORAL_TABLET | Freq: Three times a day (TID) | ORAL | 1 refills | Status: DC | PRN

## 2017-12-01 NOTE — Progress Notes (Signed)
Patient ID: Rick Kim MRN: [id number], DOB: [birth year], 47 y.o. Date of Encounter: @DATE @  Chief Complaint:  Chief Complaint  Patient presents with  . left arm pain    symptoms for 2 weeks   . skin tags/moles on back    HPI: 47 y.o. year old male  presents with above.    ----------------------------------------------------------------------------------------------------------------------------------------------------------------------------------------------------------------------------------------------------------------------------  I REVIEWED THE FOLLOWING INFORMATION THAT WAS DOCUMENTED IN MY OV NOTE 09/09/2016:  States that he has been having problems with pain and paresthesias down his arms. He says that he feels it all the way down the entire arm. Says that he feels a throbbing discomfort and also a feeling as if they have "fallen asleep ". Says that if it happens during the daytime then he is able to shake out his arms and move them around. Says that at night he will wake up and they just feel sore. Says that sometimes when he is "gripping the steering wheel and also when he is drawing or painting --at these times his entire arms bilaterally will feel numb. Asked if he has felt any neck pain. 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 Has not been dropping any tools etc.  X-ray cervical spine did show : "FINDINGS: The cervical vertebral bodies are preserved in height. There is mild disc space narrowing at C3-4. The prevertebral soft tissue spaces are normal. There is no perched facet. There is mild bony encroachment upon the neural foramina  at the C3-4 level bilaterally. The odontoid is intact.  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 Mild bony encroachment upon the neural foramen bilaterally at this level. Given the patient's symptoms, cervical spine MRI would be a useful next imaging step."  06/21/2016: At [place] 05/13/16 I placed another order for MRI cervical spine.  Medicaid denied it again ---05/20/2016---sent in prescription for Flexeril 10 mg 1 by mouth 3 times a day when necessary and cautioned regarding drowsiness. Medicaid paperwork states that patient has to have a failure to improve after a 6 week trial of physician - guided clinical care (treatment or observation) and clinical reevaluation. Today patient states that he is taking the Flexeril every night but cannot take it during the day because it does make him drowsy and cannot take it with his work. Is taking it every night. Has not noticing a big difference in symptoms. Asked if he is dropping any objects because of decreased strength and decreased grip. Says that if he feels his hand getting numb, then he will go ahead and set down an object and shake his hands and arms out and move his arms and does this before it gets to that point of dropping an object. Still having weakness paresthesias pain in both arms and hands.  09/09/2016: He is seeing Dr. Cyndy Freeze regarding his paresthesias in his arms and hands. He says that he did not think that the bone spur in his neck was causing his symptoms. Says that he did nerve conduction studies that showed a lot of nerve damage in his arms. He is scheduled for surgery to his  left hand with Dr. Cyndy Freeze on May 10. Says that "there is a lot of nerve damage in there". Says that he will definitely have to have surgery on his right hand in the future but needs to do one at the time. Says the Ambien has continued to work and just needs refills on this. 1. Cervical spine disease 2. Nerve damage in arms, hands 09/09/2016: --This is being  managed by Dr. Christella Noa. He is scheduled for surgery to the left hand with Dr. Cyndy Freeze on May 10. Will need future surgery to the right hand.   -------------------------------------------------------------------------------------------  Today he reports that the above information is correct.  States that he did have the surgery on the left arm and that has been so much better. He reports that back then, when he was seeing Dr. Christella Noa, that when he checked out to Dr. Hewitt Shorts office that the office staff had told him that they would contact him about scheduling surgery on the right arm/hand.  Patient reports that he "never heard anything ".  Reports that the right arm is causing significant symptoms.  Needs referral to follow up with Dr. Christella Noa.  He also reports that he has been having left shoulder pain.  States this started about 2-1/2 weeks ago.  Says that he had been cutting some wood with a chainsaw and soon after is when noticed this discomfort.  Did not know if it was related to the chain sawing or "if he had slept on it the wrong way ".  Points to area along the edge of the scapula -- at the upper portion of the scapula -- as area of pain.  Also states that when he tries to abduct the shoulder, this causes pain in that same area at the upper scapula but also up at the top of the shoulder.  He also reports that he has recently noticed a skin lesion on his left upper back/shoulder that he went to have checked.  No other concerns to address today.   Past Medical History:  Diagnosis Date  . Anxiety   . Depression   . Disorder of lumbar spine   . Neuromuscular disorder ([place])   . Smoker      Home Meds: Outpatient Medications Prior to Visit  Medication Sig Dispense Refill  . acetaminophen-codeine (TYLENOL #3) 300-30 MG tablet Take 1 tablet by mouth every 6 (six) hours as needed for moderate pain. 28 tablet 0  . zolpidem (AMBIEN) 10 MG tablet TAKE 1 TABLET BY MOUTH AT BEDTIME AS NEEDED FOR  SLEEP 30 tablet 0  . buPROPion (WELLBUTRIN SR) 150 MG 12 hr tablet Take 1 daily for 5 days then take 1 twice a day (Patient not taking: Reported on 05/30/2017) 60 tablet 5  . cyclobenzaprine (FLEXERIL) 10 MG tablet Take 1 tablet (10 mg total) by mouth 3 (three) times daily as needed for muscle spasms. (Patient not taking: Reported on 05/30/2017) 30 tablet 1   No facility-administered medications prior to visit.     Allergies:  Allergies  Allergen Reactions  . No Known Allergies     Social History   Socioeconomic History  . Marital status: Married    Spouse name: Not on file  . Number of children: Not on file  . Years of education: Not on file  . Highest education level: Not on file  Occupational History  . Not on file  Social Needs  . Financial resource strain: Not on file  . Food insecurity:  Worry: Not on file    Inability: Not on file  . Transportation needs:    Medical: Not on file    Non-medical: Not on file  Tobacco Use  . Smoking status: Current Every Day Smoker    Packs/day: 1.00    Types: Cigarettes  . Smokeless tobacco: Never Used  Substance and Sexual Activity  . Alcohol use: Yes    Comment: socially  . Drug use: No  . Sexual activity: Not on file  Lifestyle  . Physical activity:    Days per week: Not on file    Minutes per session: Not on file  . Stress: Not on file  Relationships  . Social connections:    Talks on phone: Not on file    Gets together: Not on file    Attends religious service: Not on file    Active member of club or organization: Not on file    Attends meetings of clubs or organizations: Not on file    Relationship status: Not on file  . Intimate partner violence:    Fear of current or ex partner: Not on file    Emotionally abused: Not on file    Physically abused: Not on file    Forced sexual activity: Not on file  Other Topics Concern  . Not on file  Social History Narrative   Entered 2015:   Married.   4 kids -- Ages 68  y/o, 79 y/o, Twins 70 y/o   2 older kids from different marriage.    Twins from new marriage --this wife had never had kids until these twins.   Works on [employer].   He walks around neighborhood routinely and rides bike.          Family History  Problem Relation Age of Onset  . Cancer Sister 76       Lung Cancer  . Alcohol abuse Father      Review of Systems:  See HPI for pertinent ROS. All other ROS negative.    Physical Exam: Blood pressure 122/70, pulse 81, temperature 97.6 F (36.4 C), temperature source Oral, resp. rate 20, height 6' (1.829 m), weight 113.8 kg (250 lb 12.8 oz), SpO2 95 %., Body mass index is 34.01 kg/m. General: WNWD WM. Appears in no acute distress. Neck: Supple. No thyromegaly. No lymphadenopathy. Lungs: Clear bilaterally to auscultation without wheezes, rales, or rhonchi. Breathing is unlabored. Heart: RRR with S1 S2. No murmurs, rubs, or gallops. Musculoskeletal:   Left Shoulder:  Forearm strength is 5/5 against resistance with abduction and abduction. He has an area of severe tenderness with palpation at the medial border of the scapula up at the upper portion.  Abduction of the left shoulder is limited secondary to pain in the top of the left shoulder near the [facility] joint. Extremities/Skin: Left Upper Back : Nodule/ Papule -- ~ 50mm diameter, ~ 59mm raised. Pearly appearance.  Neuro: Alert and oriented X 3. Moves all extremities spontaneously. Gait is normal. CNII-XII grossly in tact. Psych:  Responds to questions appropriately with a normal affect.     ASSESSMENT AND PLAN:  47 y.o. year old male with    1. Neuropathy, arm, right Will refer back to Dr. Christella Noa for further management.  I did add a comment to the referral to schedule with Dr. Christella Noa. - Ambulatory referral to Neurosurgery  2. Cervical spine disease Will refer back to Dr. Christella Noa for further management.  I did add a comment to the referral to schedule  with Dr. Christella Noa. - Ambulatory  referral to Neurosurgery  3. Skin cancer screening He is scheduling follow-up visit with me for biopsy of skin lesion on left upper back.  4. Left shoulder tendonitis Have given and reviewed hand out----discussed that he may need to adjust his posture and position when he is doing his work. Also have reviewed specific stretches for him to do. I have discussed applying heat to the area using heating pad and warm water in the shower.  Also have discussed massaging area and even using tennis ball to the left scapular region. Cautioned that the Flexeril can cause drowsiness and if it does then only use at night. Also discussed to take the Mobic with food. - cyclobenzaprine (FLEXERIL) 10 MG tablet; Take 1 tablet (10 mg total) by mouth 3 (three) times daily as needed for muscle spasms.  Dispense: 30 tablet; Refill: 1 - meloxicam (MOBIC) 7.5 MG tablet; Take 1 tablet (7.5 mg total) by mouth daily.  Dispense: 30 tablet; Refill: 2   Signed, [id number] [place], [place], [facility] 12/01/2017 3:55 PM

## 2017-12-21 ENCOUNTER — Ambulatory Visit: Payer: Medicaid Other | Admitting: Physician Assistant

## 2017-12-22 ENCOUNTER — Encounter: Payer: Self-pay | Admitting: Physician Assistant

## 2017-12-26 ENCOUNTER — Ambulatory Visit: Payer: Medicaid Other | Admitting: Physician Assistant

## 2017-12-26 ENCOUNTER — Encounter: Payer: Self-pay | Admitting: Physician Assistant

## 2017-12-28 ENCOUNTER — Encounter: Payer: Self-pay | Admitting: Physician Assistant

## 2018-01-13 ENCOUNTER — Other Ambulatory Visit: Payer: Self-pay | Admitting: Physician Assistant

## 2018-01-13 DIAGNOSIS — G4700 Insomnia, unspecified: Secondary | ICD-10-CM

## 2018-01-13 NOTE — Telephone Encounter (Signed)
Last OV 12/01/2017 Last refill 11/10/2017 Ok to refill?

## 2018-01-26 ENCOUNTER — Telehealth: Payer: Self-pay

## 2018-01-26 NOTE — Telephone Encounter (Signed)
Prior Authorization started on Zolpidem, The medication has been approved will notify pharmacy

## 2018-03-06 ENCOUNTER — Ambulatory Visit: Payer: Medicaid Other | Admitting: Physician Assistant

## 2018-03-08 ENCOUNTER — Ambulatory Visit: Payer: Medicaid Other | Admitting: Family Medicine

## 2018-03-08 ENCOUNTER — Encounter: Payer: Self-pay | Admitting: Family Medicine

## 2018-03-08 ENCOUNTER — Other Ambulatory Visit: Payer: Self-pay

## 2018-03-08 VITALS — BP 130/72 | HR 72 | Temp 98.3°F | Resp 16 | Ht 72.0 in | Wt 254.0 lb

## 2018-03-08 DIAGNOSIS — Z23 Encounter for immunization: Secondary | ICD-10-CM | POA: Diagnosis not present

## 2018-03-08 DIAGNOSIS — D239 Other benign neoplasm of skin, unspecified: Secondary | ICD-10-CM | POA: Diagnosis not present

## 2018-03-08 DIAGNOSIS — D489 Neoplasm of uncertain behavior, unspecified: Secondary | ICD-10-CM

## 2018-03-08 DIAGNOSIS — L989 Disorder of the skin and subcutaneous tissue, unspecified: Secondary | ICD-10-CM | POA: Diagnosis not present

## 2018-03-08 NOTE — Patient Instructions (Signed)
Excision of Skin Lesions, Care After Refer to this sheet in the next few weeks. These instructions provide you with information about caring for yourself after your procedure. Your health care provider may also give you more specific instructions. Your treatment has been planned according to current medical practices, but problems sometimes occur. Call your health care provider if you have any problems or questions after your procedure. What can I expect after the procedure? After your procedure, it is common to have pain or discomfort at the excision site. Follow these instructions at home:  Take over-the-counter and prescription medicines only as told by your health care provider.  Follow instructions from your health care provider about: ? How to take care of your excision site. You should keep the site clean, dry, and protected for at least 48 hours. ? When and how you should change your bandage (dressing). ? When you should remove your dressing. ? Removing whatever was used to close your excision site.  Check the excision area every day for signs of infection. Watch for: ? Redness, swelling, or pain. ? Fluid, blood, or pus.  For bleeding, apply gentle but firm pressure to the area using a folded towel for 20 minutes.  Avoid high-impact exercise and activities until the stitches (sutures) are removed or the area heals.  Follow instructions from your health care provider about how to minimize scarring. Avoid sun exposure until the area has healed. Scarring should lessen over time.  Keep all follow-up visits as told by your health care provider. This is important. Contact a health care provider if:  You have a fever.  You have redness, swelling, or pain at the excision site.  You have fluid, blood, or pus coming from the excision site.  You have ongoing bleeding at the excision site.  You have pain that does not improve in 2-3 days after your procedure.  You notice skin  irregularities or changes in sensation. This information is not intended to replace advice given to you by your health care provider. Make sure you discuss any questions you have with your health care provider. Document Released: 09/17/2014 Document Revised: 10/09/2015 Document Reviewed: 06/19/2014 Elsevier Interactive Patient Education  2018 [employer] Inc[employer]  

## 2018-03-08 NOTE — Progress Notes (Signed)
Patient ID: Rick Kim, male    DOB: 02-01-1971, 47 y.o.   MRN: [id number]  PCP: Orlena Sheldon, PA-C  Chief Complaint  Patient presents with  . Mole Removal    has 5 red raised mole like areas to back and 1 on L shoulder he wants removed.     Subjective:   Rick Kim is a 47 y.o. male, presents to clinic with CC of multiple lesions on his back that he was scheduled to have removed today by his PCP who is unfortunately no longer at this clinic.  He states that she told him to have them removed to get a diagnosis, he has multiple flesh-colored smooth bumps to his back there is one along his lower spine which is bigger and more bothersome to him because he will bump it occasionally or it will become irritated.  He has not had any skin lesions removed in the past before.  He has family history of lung cancer and patient is a current smoker but no other cancer history in the family.   Patient Active Problem List   Diagnosis Date Noted  . Insomnia 04/29/2016  . 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 Family history of lung cancer 03/06/2014  . Smoker   . Disorder of lumbar spine      Prior to Admission medications   Medication Sig Start Date End Date Taking? Authorizing Provider  acetaminophen-codeine (TYLENOL #3) 300-30 MG tablet Take 1 tablet by mouth every 6 (six) hours as needed for moderate pain. 09/23/16  Yes Ashok Pall, MD  buPROPion [facility] SR) 150 MG 12 hr tablet Take 1 daily for 5 days then take 1 twice a day 09/09/16  Yes Dena Billet B, PA-C  cyclobenzaprine (FLEXERIL) 10 MG tablet Take 1 tablet (10 mg total) by mouth 3 (three) times daily as needed for muscle spasms. 12/01/17  Yes Orlena Sheldon, PA-C  meloxicam (MOBIC) 7.5 MG tablet Take 1 tablet (7.5 mg total) by mouth daily. 12/01/17 12/01/18 Yes Dixon, Lonie Peak, PA-C  zolpidem (AMBIEN) 10 MG tablet TAKE 1 TABLET BY MOUTH AT BEDTIME AS NEEDED FOR SLEEP 01/17/18  Yes Orlena Sheldon, PA-C     Allergies  Allergen  Reactions  . No Known Allergies      Family History  Problem Relation Age of Onset  . Cancer Sister 25       Lung Cancer  . Alcohol abuse Father      Social History   Socioeconomic History  . Marital status: Married    Spouse name: Not on file  . Number of children: Not on file  . Years of education: Not on file  . Highest education level: Not on file  Occupational History  . Not on file  Social Needs  . Financial resource strain: Not on file  . Food insecurity:    Worry: Not on file    Inability: Not on file  . Transportation needs:    Medical: Not on file    Non-medical: Not on file  Tobacco Use  . Smoking status: Current Every Day Smoker    Packs/day: 1.00    Types: Cigarettes  . Smokeless tobacco: Never Used  Substance and Sexual Activity  . Alcohol use: Yes    Comment: socially  . Drug use: No  . Sexual activity: Not on file  Lifestyle  . Physical activity:    Days per week: Not on file    Minutes per session: Not  on file  . Stress: Not on file  Relationships  . Social connections:    Talks on phone: Not on file    Gets together: Not on file    Attends religious service: Not on file    Active member of club or organization: Not on file    Attends meetings of clubs or organizations: Not on file    Relationship status: Not on file  . Intimate partner violence:    Fear of current or ex partner: Not on file    Emotionally abused: Not on file    Physically abused: Not on file    Forced sexual activity: Not on file  Other Topics Concern  . Not on file  Social History Narrative   Entered 2015:   Married.   4 kids -- Ages 28 y/o, 35 y/o, Twins 68 y/o   2 older kids from different marriage.    Twins from new marriage --this wife had never had kids until these twins.   Works on [employer].   He walks around neighborhood routinely and rides bike.           Review of Systems  Constitutional: Negative.   HENT: Negative.   Eyes: Negative.   Respiratory:  Negative.   Cardiovascular: Negative.   Gastrointestinal: Negative.   Endocrine: Negative.   Genitourinary: Negative.   Musculoskeletal: Negative.   Skin: Negative.   Allergic/Immunologic: Negative.   Neurological: Negative.   Hematological: Negative.   Psychiatric/Behavioral: Negative.   All other systems reviewed and are negative.      Objective:    Vitals:   03/08/18 1407  BP: 130/72  Pulse: 72  Resp: 16  Temp: 98.3 F (36.8 C)  TempSrc: Oral  SpO2: 96%  Weight: 254 lb (115.2 kg)  Height: 6' (1.829 m)       Physical Exam  Constitutional: He appears well-developed.  HENT:  Head: Normocephalic and atraumatic.  Nose: Nose normal.  Eyes: Conjunctivae are normal. Right eye exhibits no discharge. Left eye exhibits no discharge.  Neck: No tracheal deviation present.  Cardiovascular: Normal rate and regular rhythm.  Pulmonary/Chest: Effort normal. No stridor. No respiratory distress.  Musculoskeletal: Normal range of motion.  Neurological: He is alert. He exhibits normal muscle tone. Coordination normal.  Skin: Skin is warm and dry. No rash noted.  Left lower back 8 mm round flesh colored raised lesion, about 26mm tall, erythema around base, soft, slightly pedunculated  Right midline back to L1 area, 57mm round flesh colored raise skin lesion  Left shoulder 79mm round flesh colored papule, raised ~49mm  Psychiatric: He has a normal mood and affect. His behavior is normal.  Nursing note and vitals reviewed.    Procedure: Excisional biopsy:  Discussed skin findings.   Patient desires excision.  Discussed risks and benefits of excisional biopsy.   Cleaned and prepped two lesions on back in usual sterile fashion, used 1% lidocaine with epinephrine for local anesthesia, 1.5 mLs per lesion for total of 3 mLs. Used excisional biopsy to remove the lesion.  I placed 3 simple interrupted sutures to each excision site, edges of incision well approximated and hemostasis achieved.   Cleaned the wound area. Covered with sterile bandage.   discussed wound care.  Pt tolerated procedure well.  <3 cc EBL.       Assessment & Plan:      ICD-10-CM   1. Skin lesion of back L98.9 Pathology Report    Pathology Report    CANCELED: Pathology  Report  2. 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 Need for immunization against influenza Z23 Flu Vaccine QUAD 36+ mos IM    Only felt that two biopsies were appropriate today, the largest of the lesions with some mild erythematous changes to the base and next largest.  Lesion to left shoulder was in a tatoo, hesitate to remove if not necessary.  Discussed with pt get results back and then scheduling for another procedure.     Delsa Grana, PA-C 03/08/18 2:58 PM

## 2018-03-09 DIAGNOSIS — M545 Low back pain: Secondary | ICD-10-CM | POA: Diagnosis not present

## 2018-03-09 DIAGNOSIS — M5441 Lumbago with sciatica, right side: Secondary | ICD-10-CM | POA: Diagnosis not present

## 2018-03-10 LAB — TISSUE PATH REPORT

## 2018-03-10 LAB — PATHOLOGY

## 2018-03-15 ENCOUNTER — Ambulatory Visit: Payer: Medicaid Other | Admitting: Family Medicine

## 2018-03-15 ENCOUNTER — Encounter: Payer: Self-pay | Admitting: Family Medicine

## 2018-03-15 VITALS — BP 130/84 | HR 73 | Temp 97.6°F | Resp 16 | Ht 72.0 in | Wt 253.4 lb

## 2018-03-15 DIAGNOSIS — Z4802 Encounter for removal of sutures: Secondary | ICD-10-CM | POA: Diagnosis not present

## 2018-03-15 DIAGNOSIS — D3617 Benign neoplasm of peripheral nerves and autonomic nervous system of trunk, unspecified: Secondary | ICD-10-CM | POA: Diagnosis not present

## 2018-03-15 DIAGNOSIS — Z9889 Other specified postprocedural states: Secondary | ICD-10-CM

## 2018-03-15 NOTE — Progress Notes (Signed)
Patient ID: Rick Kim, male    DOB: [birth year], 47 y.o.   MRN: [id number]  PCP: Orlena Sheldon, PA-C  Chief Complaint  Patient presents with  . Suture / Staple Removal    Subjective:   Rick Kim is a 47 y.o. 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  Patient has not had any pain, swelling, discharge or irritation with the incision sites.  He has other areas with similar skin lesions he will check to see if further removal would be covered for benign skin lesions, currently no other lesions that have irritation trauma or pain.  Does notice his skin lesion to his left shoulder that he can feel with changing his clothes or occasionally will bump it but is not painful currently and there is no trauma currently.   Patient Active Problem List   Diagnosis Date Noted  . Insomnia 04/29/2016  . 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 Family history of lung cancer 03/06/2014  . Smoker   . Disorder of lumbar spine      Prior to Admission medications   Medication Sig Start Date End Date Taking? Authorizing Provider  acetaminophen-codeine (TYLENOL #3) 300-30 MG tablet Take 1 tablet by mouth every 6 (six) hours as needed for moderate pain. 09/23/16  Yes Ashok Pall, MD  buPROPion [facility] SR) 150 MG 12 hr tablet Take 1 daily for 5 days then take 1 twice a day 09/09/16  Yes Dena Billet B, PA-C  cyclobenzaprine (FLEXERIL) 10 MG tablet Take 1 tablet (10 mg total) by mouth 3 (three) times daily as needed for muscle spasms. 12/01/17  Yes Orlena Sheldon, PA-C  meloxicam (MOBIC) 7.5 MG tablet Take 1 tablet (7.5 mg total) by mouth daily. 12/01/17 12/01/18 Yes Dixon, Lonie Peak, PA-C  zolpidem (AMBIEN) 10 MG tablet TAKE 1 TABLET BY MOUTH AT BEDTIME AS NEEDED FOR SLEEP 01/17/18  Yes Orlena Sheldon, PA-C     Allergies  Allergen Reactions  . No Known Allergies       Family History  Problem Relation Age of Onset  . Cancer Sister 69       Lung Cancer  . Alcohol abuse Father      Social History   Socioeconomic History  . Marital status: Married    Spouse name: Not on file  . Number of children: Not on file  . Years of education: Not on file  . Highest education level: Not on file  Occupational History  . Not on file  Social Needs  . Financial resource strain: Not on file  . Food insecurity:    Worry: Not on file    Inability: Not on file  . Transportation needs:    Medical: Not on file    Non-medical: Not on file  Tobacco Use  . Smoking status: Current Every Day Smoker    Packs/day: 1.00    Types: Cigarettes  . Smokeless tobacco: Never Used  Substance and Sexual Activity  . Alcohol use: Yes    Comment: socially  . Drug use: No  . Sexual activity: Not on file  Lifestyle  . Physical activity:    Days per week: Not on file    Minutes per session: Not on file  . Stress: Not on file  Relationships  . Social connections:    Talks on phone: Not on file  Gets together: Not on file    Attends religious service: Not on file    Active member of club or organization: Not on file    Attends meetings of clubs or organizations: Not on file    Relationship status: Not on file  . Intimate partner violence:    Fear of current or ex partner: Not on file    Emotionally abused: Not on file    Physically abused: Not on file    Forced sexual activity: Not on file  Other Topics Concern  . Not on file  Social History Narrative   Entered 2015:   Married.   4 kids -- Ages 30 y/o, 77 y/o, Twins 63 y/o   2 older kids from different marriage.    Twins from new marriage --this wife had never had kids until these twins.   Works on [employer].   He walks around neighborhood routinely and rides bike.           Review of Systems  Constitutional: Negative.   HENT: Negative.   Eyes: Negative.   Respiratory: Negative.   Cardiovascular:  Negative.   Gastrointestinal: Negative.   Endocrine: Negative.   Genitourinary: Negative.   Musculoskeletal: Negative.   Skin: Negative.   Allergic/Immunologic: Negative.   Neurological: Negative.   Hematological: Negative.   Psychiatric/Behavioral: Negative.   All other systems reviewed and are negative.      Objective:    Vitals:   03/15/18 1437  BP: 130/84  Pulse: 73  Resp: 16  Temp: 97.6 F (36.4 C)  TempSrc: Oral  SpO2: 95%  Weight: 253 lb 6.4 oz (114.9 kg)  Height: 6' (1.829 m)       Physical Exam  Constitutional: He appears well-developed and well-nourished. No distress.  HENT:  Head: Normocephalic and atraumatic.  Nose: Nose normal.  Eyes: Conjunctivae are normal. Right eye exhibits no discharge. Left eye exhibits no discharge.  Neck: No tracheal deviation present.  Cardiovascular: Normal rate and regular rhythm.  Pulmonary/Chest: Effort normal. No respiratory distress.  Musculoskeletal: Normal range of motion.  Neurological: He is alert.  Skin: Skin is warm and dry. No rash noted. He is not diaphoretic.  1/2 cm incision to midline low thoracic back, 3 sutures, incision clean, dry and intact, mild erythema/pink skin to around incision and to around sutures, no induration no warmth, no tenderness. Similar appearance to second incision to right low back, 1.5 cm incision, also clean, dry and intact with 3 sutures present  Psychiatric: He has a normal mood and affect. His behavior is normal.  Nursing note and vitals reviewed.   Suture Removal Date/Time: 03/15/2018 5:06 PM Performed by: Delsa Grana, PA-C Authorized by: Delsa Grana, PA-C  Body area: trunk Location details: back Wound Appearance: clean and pink Sutures Removed: 6 Post-removal: Steri-Strips applied Facility: sutures placed in this facility Patient tolerance: Patient tolerated the procedure well with no immediate complications         Assessment & Plan:      ICD-10-CM   1.  Encounter for removal of sutures Z48.02    mild localized reaction, no infection, both incisions C, D, intact F/up for further excisions if desired, but may be cosmetic  2. Status post excisional biopsy Z98.890   3. 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

## 2018-03-21 DIAGNOSIS — M5416 Radiculopathy, lumbar region: Secondary | ICD-10-CM | POA: Diagnosis not present

## 2018-03-30 DIAGNOSIS — M545 Low back pain: Secondary | ICD-10-CM | POA: Diagnosis not present

## 2018-03-30 DIAGNOSIS — M5441 Lumbago with sciatica, right side: Secondary | ICD-10-CM | POA: Diagnosis not present

## 2018-04-01 IMAGING — CR DG CHEST 2V
2 series · 2 of 2 positions shown · non-contrast
Comparison: 04/22/2016

CLINICAL DATA: Short of breath

EXAM:
CHEST  2 VIEW

[chest pa]
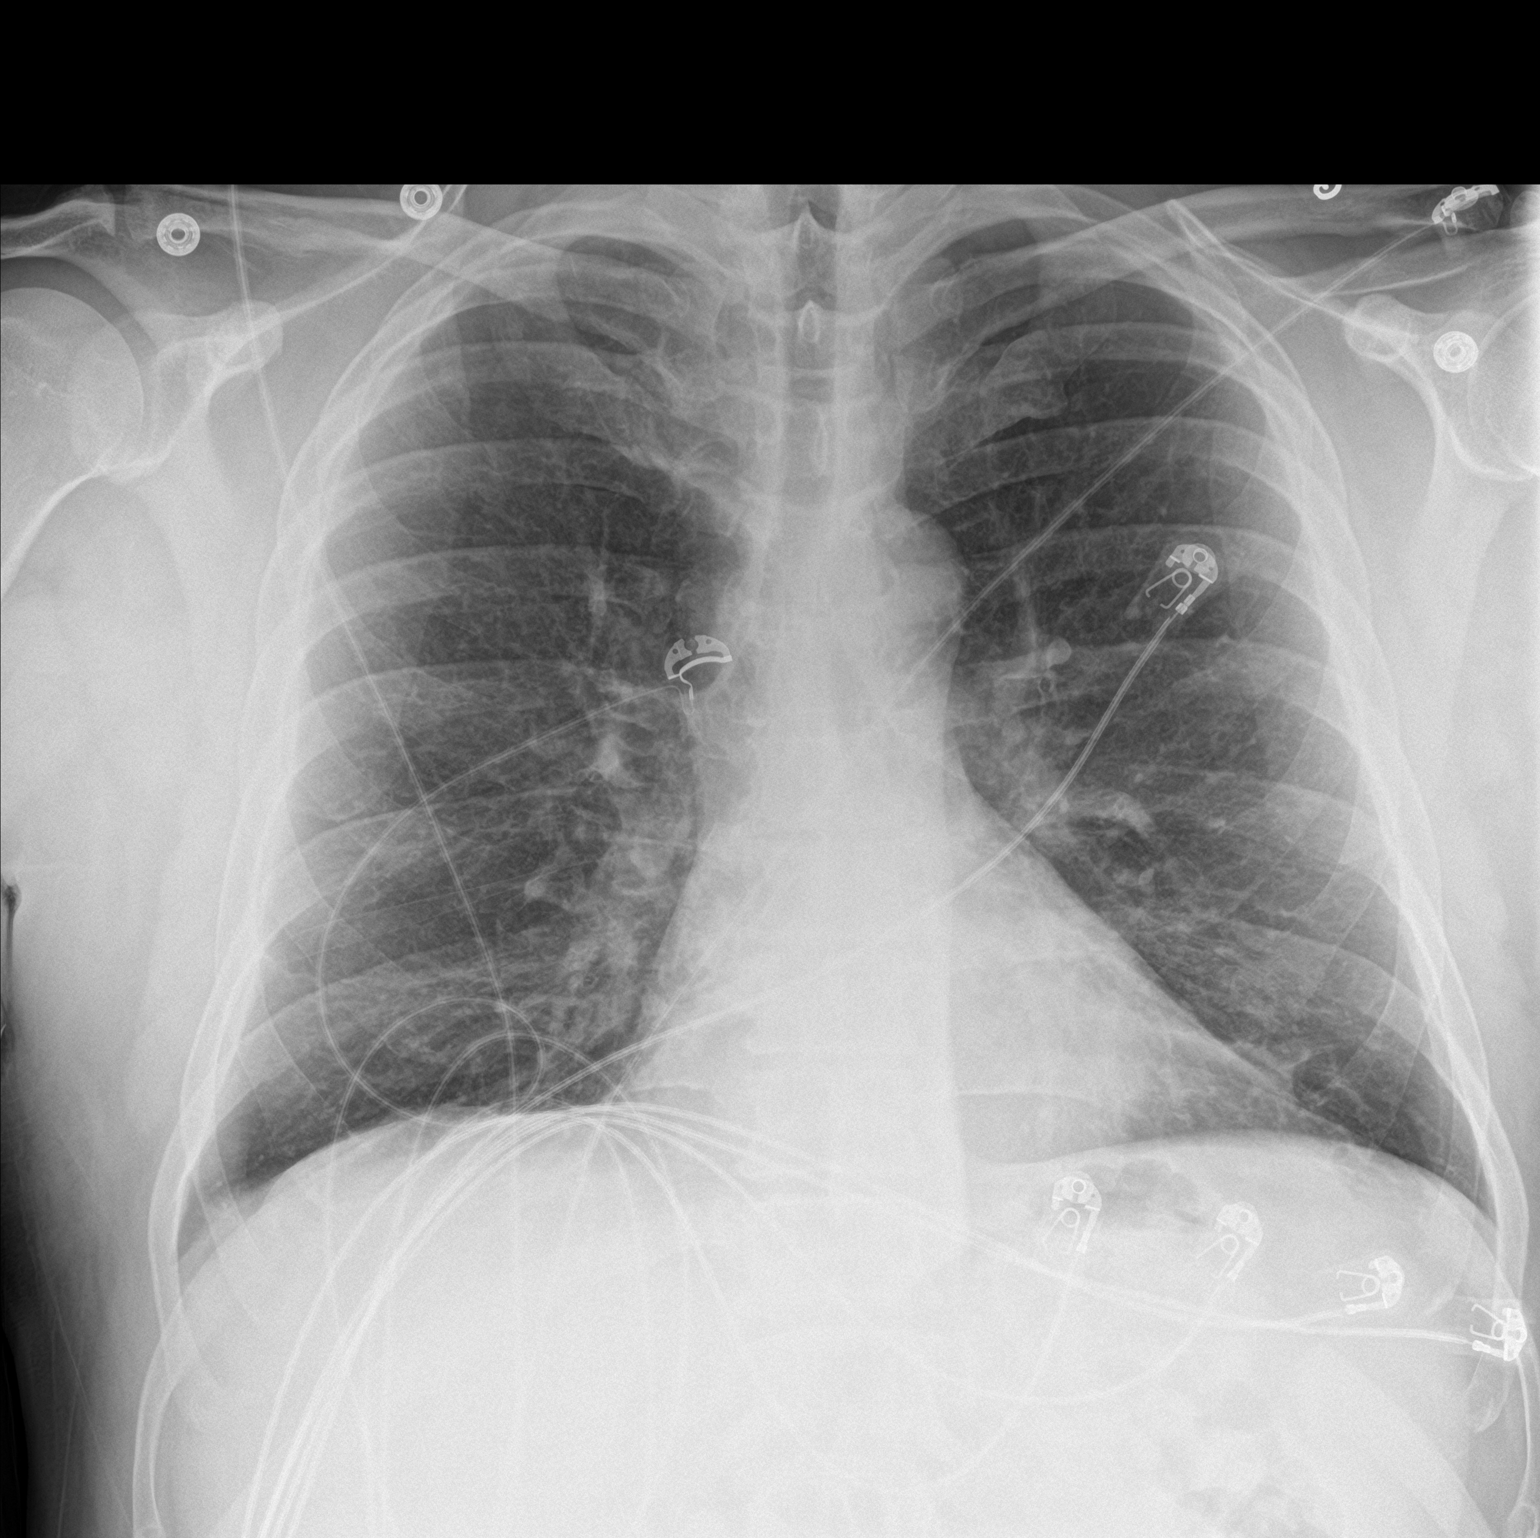

[chest lat]
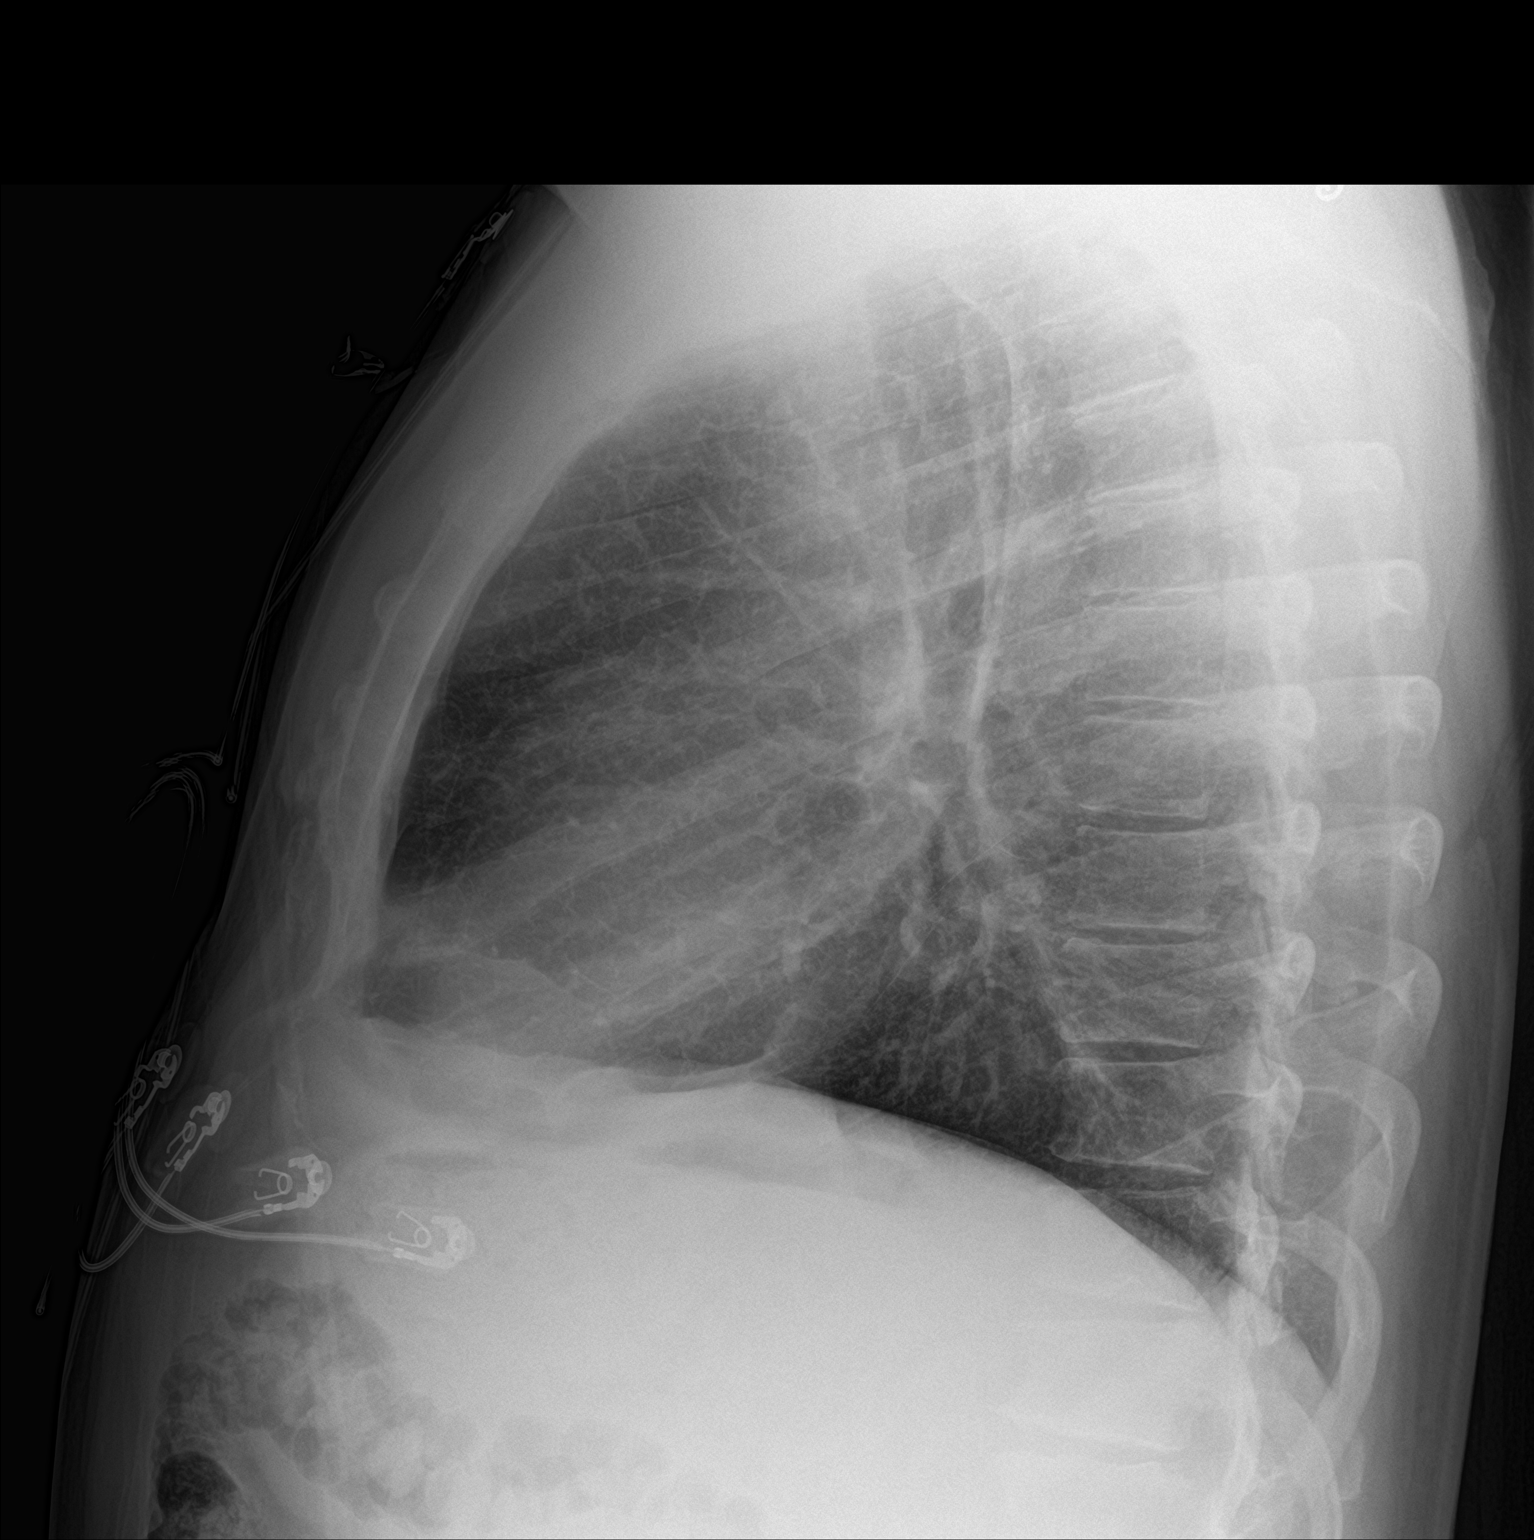

[2 of 2 positions shown; findings below may reference images not displayed]

FINDINGS: Minimal scarring or atelectasis at the lingula. No acute
consolidation or effusion. Normal cardiomediastinal silhouette. No
pneumothorax.
IMPRESSION: No active cardiopulmonary disease.

## 2018-04-01 IMAGING — CT CT HEAD W/O CM
4 series · 16 of 47 positions shown, 18 images · non-contrast
Comparison: None.

CLINICAL DATA: Near-syncope. Assess TIA. History of anxiety,
neuromuscular disorder.

EXAM:
CT HEAD WITHOUT CONTRAST
TECHNIQUE: Contiguous axial images were obtained from the base of the skull
through the vertex without intravenous contrast.

[Series 3: head without · axial · non-contrast · 0.45mm/px · z∈[-83,+37]mm · 7 of 34 slices shown, 9 images]
[im 5/34  brain]
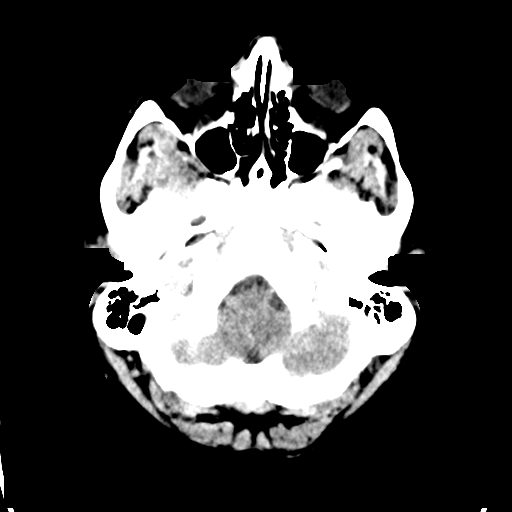
[im 5/34  bone]
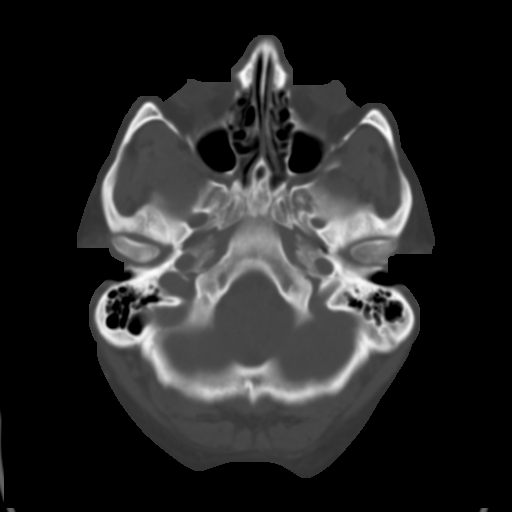
[im 9/34  brain]
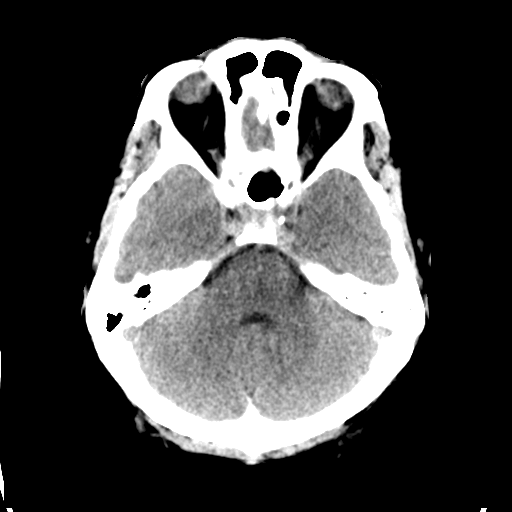
[im 13/34  brain]
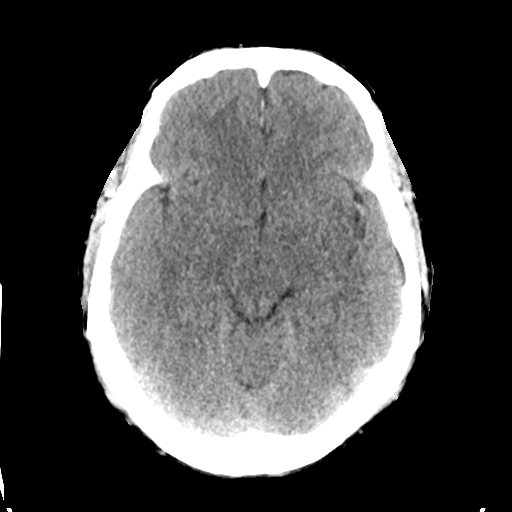
[im 17/34  brain]
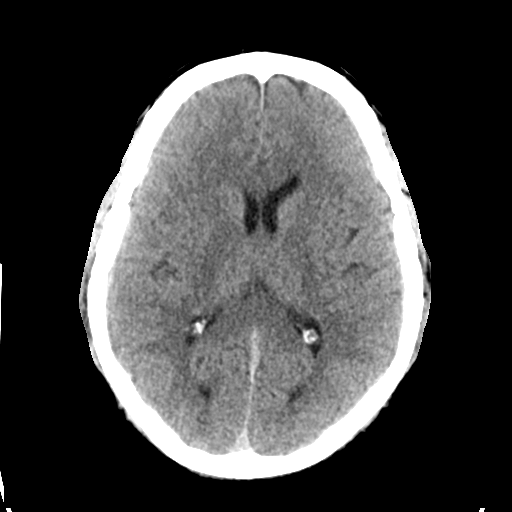
[im 21/34  brain]
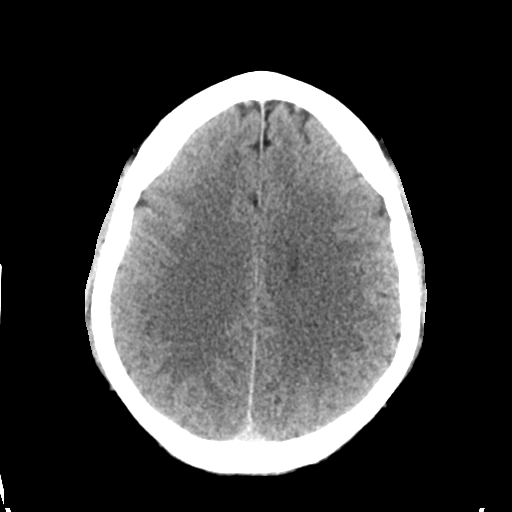
[im 21/34  bone]
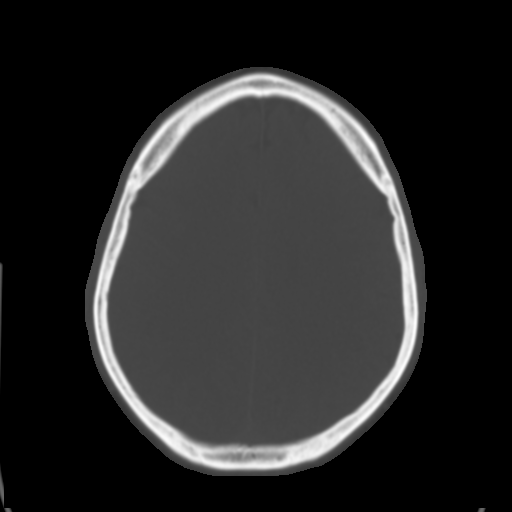
[im 25/34  brain]
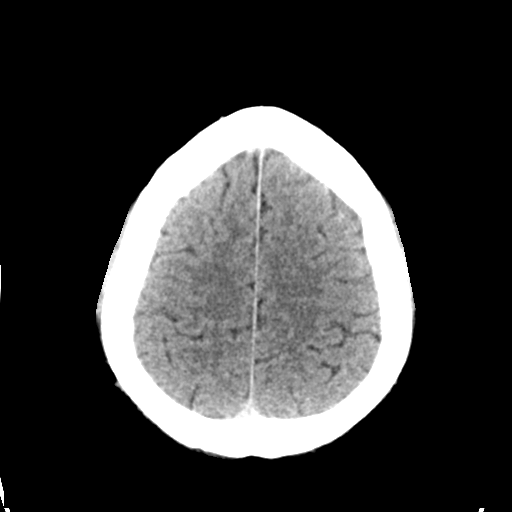
[im 29/34  brain]
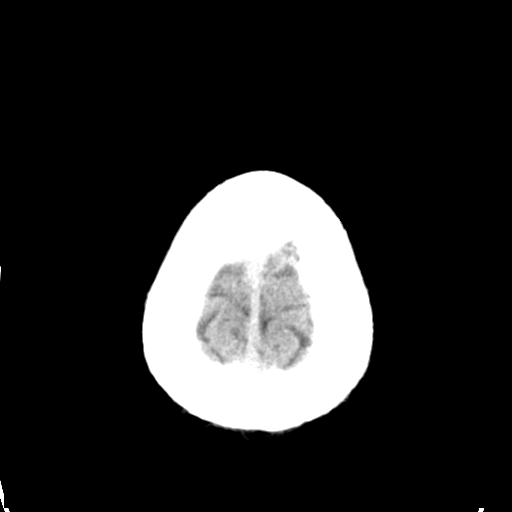

[Series 4: head bone · axial · 0.45mm/px · z∈[-87,-55]mm · 3 of 84 slices shown]
[im 9/84  bone]
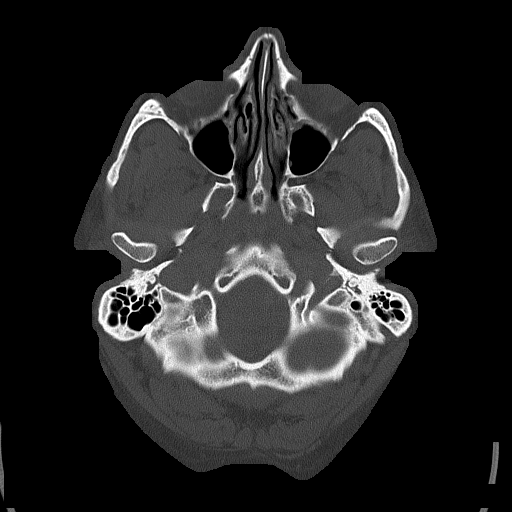
[im 17/84  bone]
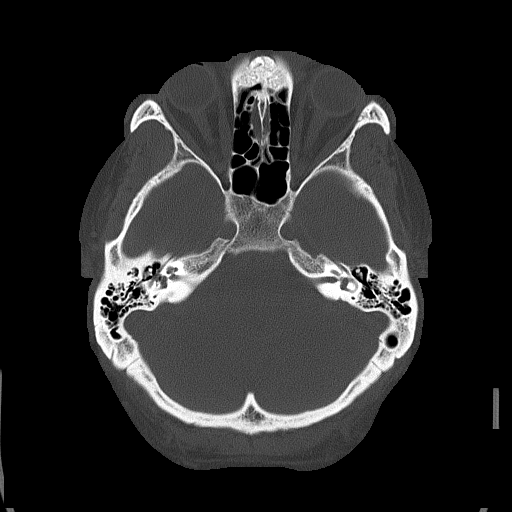
[im 25/84  bone]
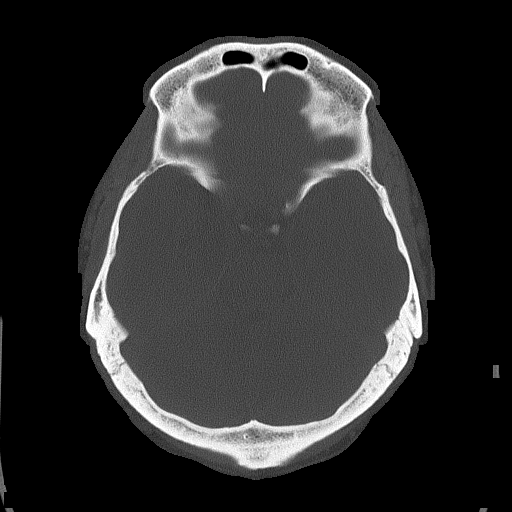

[Series 5: head without cor · coronal · non-contrast · 0.33mm/px · 3 of 73 slices shown]
[im 25/73  brain]
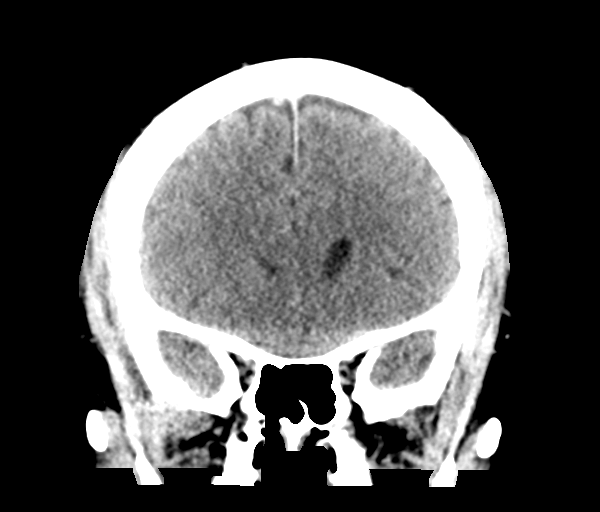
[im 33/73  brain]
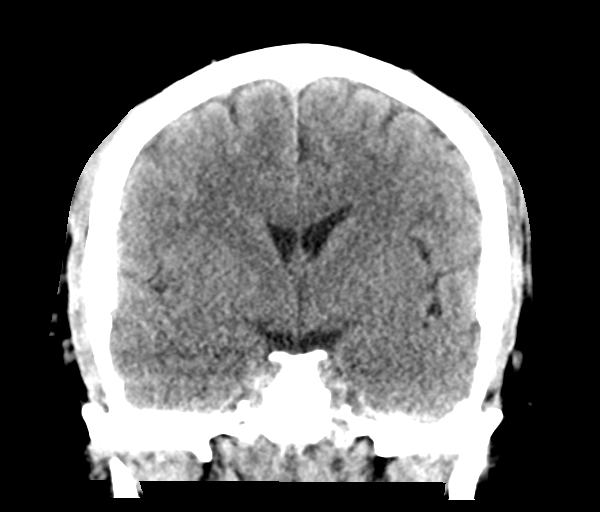
[im 41/73  brain]
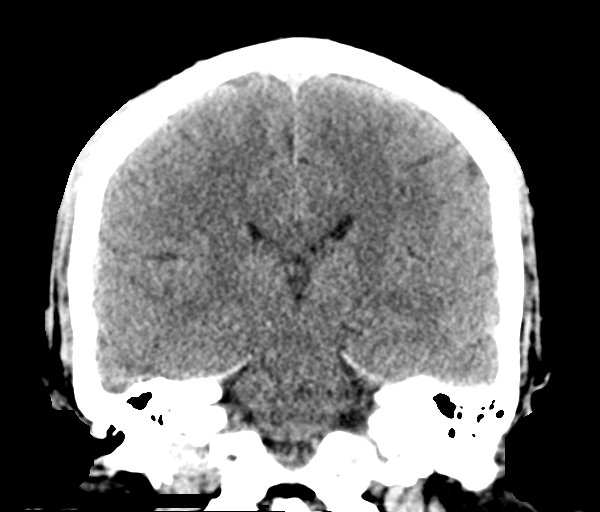

[Series 6: head without sag · sagittal · non-contrast · 0.33mm/px · 3 of 67 slices shown]
[im 23/67  brain]
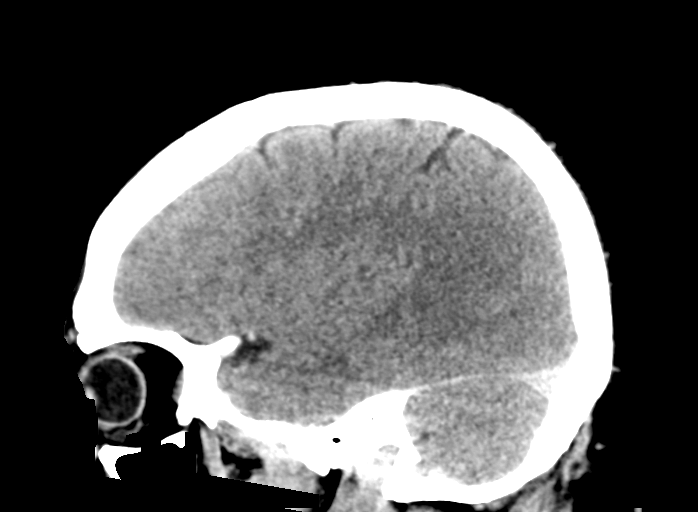
[im 34/67  brain]
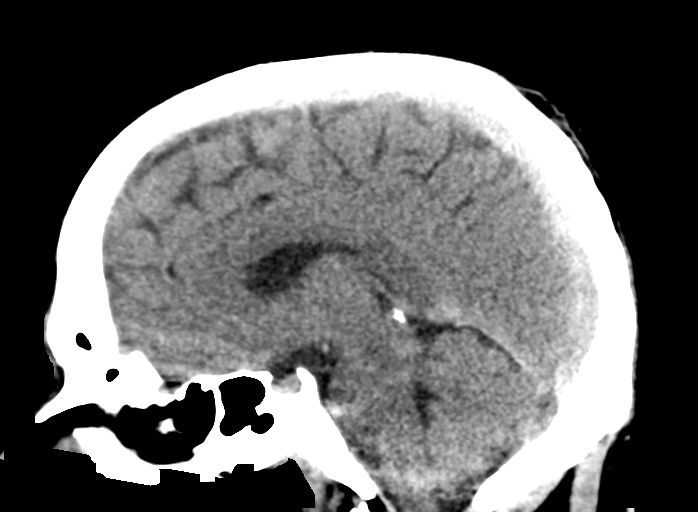
[im 45/67  brain]
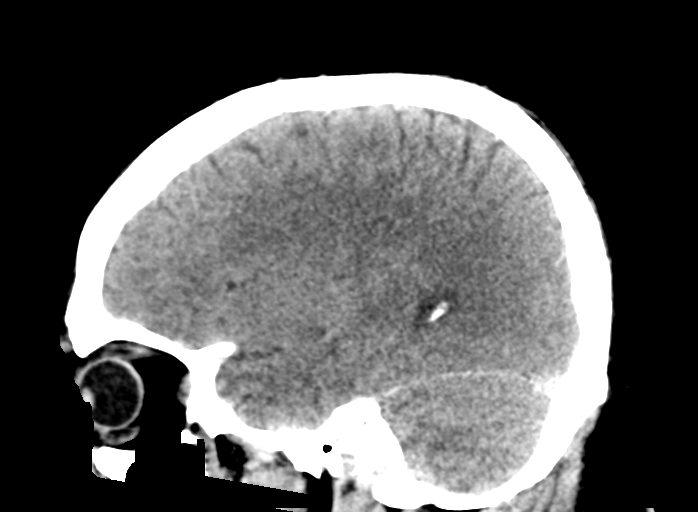

[16 of 47 positions shown; findings below may reference images not displayed]

FINDINGS: BRAIN: No intraparenchymal hemorrhage, mass effect nor midline
shift. The ventricles and sulci are normal. No acute large vascular
territory infarcts. No abnormal extra-axial fluid collections. Basal
cisterns are patent.

VASCULAR: Unremarkable.

SKULL/SOFT TISSUES: No skull fracture. No significant soft tissue
swelling.

ORBITS/SINUSES: The included ocular globes and orbital contents are
normal.Trace paranasal sinus mucosal thickening, bilateral maxillary
small mucosal retention cysts. Mastoid air cells are well aerated.

OTHER: None.
IMPRESSION: Normal noncontrast CT HEAD.

## 2019-04-16 ENCOUNTER — Other Ambulatory Visit: Payer: Self-pay

## 2019-04-16 DIAGNOSIS — Z20822 Contact with and (suspected) exposure to covid-19: Secondary | ICD-10-CM

## 2019-04-16 DIAGNOSIS — Z20828 Contact with and (suspected) exposure to other viral communicable diseases: Secondary | ICD-10-CM | POA: Diagnosis not present

## 2019-04-17 LAB — NOVEL CORONAVIRUS, NAA: SARS-CoV-2, NAA: NOT DETECTED

## 2019-04-18 ENCOUNTER — Telehealth: Payer: Self-pay | Admitting: *Deleted

## 2019-04-18 NOTE — Telephone Encounter (Signed)
Patient called and was given negative covid results . 

## 2019-05-31 DIAGNOSIS — Z03818 Encounter for observation for suspected exposure to other biological agents ruled out: Secondary | ICD-10-CM | POA: Diagnosis not present

## 2019-05-31 DIAGNOSIS — Z20828 Contact with and (suspected) exposure to other viral communicable diseases: Secondary | ICD-10-CM | POA: Diagnosis not present

## 2019-08-03 ENCOUNTER — Ambulatory Visit: Payer: Medicaid Other | Attending: Internal Medicine

## 2019-08-03 DIAGNOSIS — Z23 Encounter for immunization: Secondary | ICD-10-CM

## 2019-08-03 NOTE — Progress Notes (Signed)
   Covid-19 Vaccination Clinic  Name:  Rick Kim    MRN: [id number] DOB: 1970/07/02  08/03/2019  [name] was observed post Covid-19 immunization for 15 minutes without incident. He was provided with Vaccine Information Sheet and instruction to access the V-Safe system.   [name] was instructed to call 911 with any severe reactions post vaccine: Marland Kitchen Difficulty breathing  . Swelling of face and throat  . A fast heartbeat  . A bad rash all over body  . Dizziness and weakness   Immunizations Administered    Name Date Dose VIS Date Route   Moderna COVID-19 Vaccine 08/03/2019  9:15 AM 0.5 mL 04/17/2019 Intramuscular   Manufacturer: Moderna   Lot: [id number]   [id number]

## 2019-09-04 ENCOUNTER — Ambulatory Visit: Payer: Medicaid Other | Attending: Internal Medicine

## 2019-09-04 DIAGNOSIS — Z23 Encounter for immunization: Secondary | ICD-10-CM

## 2019-09-04 NOTE — Progress Notes (Signed)
   Covid-19 Vaccination Clinic  Name:  Rick Kim    MRN: [id number] DOB: 10-07-1970  09/04/2019  Rick Kim was observed post Covid-19 immunization for 15 minutes without incident. He was provided with Vaccine Information Sheet and instruction to access the V-Safe system.   Rick Kim was instructed to call 911 with any severe reactions post vaccine: Marland Kitchen Difficulty breathing  . Swelling of face and throat  . A fast heartbeat  . A bad rash all over body  . Dizziness and weakness   Immunizations Administered    Name Date Dose VIS Date Route   Moderna COVID-19 Vaccine 09/04/2019  8:29 AM 0.5 mL 04/2019 Intramuscular   Manufacturer: Moderna   Lot: [id number]   [id number]

## 2020-07-01 ENCOUNTER — Encounter: Payer: Self-pay | Admitting: Family Medicine

## 2020-07-01 ENCOUNTER — Other Ambulatory Visit: Payer: Self-pay

## 2020-07-01 ENCOUNTER — Ambulatory Visit: Payer: Medicaid Other | Admitting: Family Medicine

## 2020-07-01 VITALS — BP 126/74 | HR 77 | Temp 97.8°F | Resp 16 | Ht 72.0 in | Wt 266.0 lb

## 2020-07-01 DIAGNOSIS — Z0000 Encounter for general adult medical examination without abnormal findings: Secondary | ICD-10-CM | POA: Diagnosis not present

## 2020-07-01 DIAGNOSIS — Z125 Encounter for screening for malignant neoplasm of prostate: Secondary | ICD-10-CM

## 2020-07-01 DIAGNOSIS — Z0001 Encounter for general adult medical examination with abnormal findings: Secondary | ICD-10-CM

## 2020-07-01 DIAGNOSIS — Z122 Encounter for screening for malignant neoplasm of respiratory organs: Secondary | ICD-10-CM

## 2020-07-01 DIAGNOSIS — Z1211 Encounter for screening for malignant neoplasm of colon: Secondary | ICD-10-CM

## 2020-07-01 DIAGNOSIS — G4700 Insomnia, unspecified: Secondary | ICD-10-CM

## 2020-07-01 MED ORDER — ZOLPIDEM TARTRATE 10 MG PO TABS: 10.0000 mg | ORAL_TABLET | Freq: Every evening | ORAL | 4 refills | Status: AC | PRN

## 2020-07-01 NOTE — Progress Notes (Signed)
Subjective:    Patient ID: Rick Kim, male    DOB: [birth year], 50 y.o.   MRN: [id number]  HPI  Patient is a very pleasant 50 year old Caucasian male here today for complete physical exam.  He denies any specific concerns other than some insomnia.  He states that he just has a very difficult time falling asleep.  In the past he is used Ambien which is worked well for him.  He states that he has trouble sleeping almost every night however we did discuss the risk of habituation and dependency on the Ambien I encouraged him to try to use it more sparingly.  Trazodone will be another option if he has to take something on a nightly basis.  My only other concern is the patient smokes.  He states has been smoking every day since he was 21.  This a 28-pack-year history of smoking.  He is due for lung cancer screening for that reason.  Also on his physical exam he has prominent peritonsillar folds and an enlarged uvula.  This virtually obscures the airway when I examine his posterior oropharynx.  He states that he does snore at night.  He is not certain if his wife is ever noticed any apneic episodes.  He does feel tired during the day but he attributes that to the fact he is not sleeping well.  Sleep apnea does run in his family.  Otherwise he is doing well with no concerns.  Because of his age he is due for prostate cancer screening and colon cancer screening.  Blood pressure today is outstanding Past Medical History:  Diagnosis Date  . Anxiety   . Depression   . Disorder of lumbar spine   . Neuromuscular disorder ([place])   . Smoker    Past Surgical History:  Procedure Laterality Date  . CARPAL TUNNEL RELEASE Left 09/23/2016   Procedure: CARPAL TUNNEL RELEASE;  Surgeon: Ashok Pall, MD;  Location: [facility];  Service: Neurosurgery;  Laterality: Left;  left  . MULTIPLE TOOTH EXTRACTIONS  2016   under light sedation   Current Outpatient Medications on File Prior to Visit  Medication Sig Dispense  Refill  . buPROPion (WELLBUTRIN SR) 150 MG 12 hr tablet Take 1 daily for 5 days then take 1 twice a day 60 tablet 5  . acetaminophen-codeine (TYLENOL #3) 300-30 MG tablet Take 1 tablet by mouth every 6 (six) hours as needed for moderate pain. (Patient not taking: Reported on 07/01/2020) 28 tablet 0  . cyclobenzaprine (FLEXERIL) 10 MG tablet Take 1 tablet (10 mg total) by mouth 3 (three) times daily as needed for muscle spasms. (Patient not taking: Reported on 07/01/2020) 30 tablet 1   No current facility-administered medications on file prior to visit.   Allergies  Allergen Reactions  . No Known Allergies    Social History   Socioeconomic History  . Marital status: Married    Spouse name: Not on file  . Number of children: Not on file  . Years of education: Not on file  . Highest education level: Not on file  Occupational History  . Not on file  Tobacco Use  . Smoking status: Current Every Day Smoker    Packs/day: 0.50    Types: Cigarettes  . Smokeless tobacco: Never Used  Vaping Use  . Vaping Use: Never used  Substance and Sexual Activity  . Alcohol use: Yes    Comment: socially  . Drug use: No  . Sexual activity: Yes  Other  Topics Concern  . Not on file  Social History Narrative   Entered 2015:   Married.   4 kids -- Ages 24 y/o, 15 y/o, Twins 66 y/o   2 older kids from different marriage.    Twins from new marriage --this wife had never had kids until these twins.   Works on [employer].   He walks around neighborhood routinely and rides bike.         Social Determinants of Health   Financial Resource Strain: Not on file  Food Insecurity: Not on file  Transportation Needs: Not on file  Physical Activity: Not on file  Stress: Not on file  Social Connections: Not on file  Intimate Partner Violence: Not on file   Family History  Problem Relation Age of Onset  . Cancer Sister 74       Lung Cancer  . Alcohol abuse Father      Review of Systems  All other systems  reviewed and are negative.      Objective:   Physical Exam Vitals reviewed.  Constitutional:      General: He is not in acute distress.    Appearance: Normal appearance. He is normal weight. He is not ill-appearing, toxic-appearing or diaphoretic.  HENT:     Head: Normocephalic and atraumatic.     Right Ear: Tympanic membrane, ear canal and external ear normal. There is no impacted cerumen.     Left Ear: Tympanic membrane, ear canal and external ear normal. There is no impacted cerumen.     Nose: Nose normal. No congestion or rhinorrhea.     Mouth/Throat:     Mouth: Mucous membranes are moist.     Pharynx: Oropharynx is clear. No oropharyngeal exudate or posterior oropharyngeal erythema.  Eyes:     General: No scleral icterus.       Right eye: No discharge.        Left eye: No discharge.     Extraocular Movements: Extraocular movements intact.     Conjunctiva/sclera: Conjunctivae normal.     Pupils: Pupils are equal, round, and reactive to light.  Neck:     Vascular: No carotid bruit.  Cardiovascular:     Rate and Rhythm: Normal rate and regular rhythm.     Pulses: Normal pulses.     Heart sounds: Normal heart sounds. No murmur heard. No friction rub. No gallop.   Pulmonary:     Effort: Pulmonary effort is normal. No respiratory distress.     Breath sounds: Normal breath sounds. No stridor. No wheezing, rhonchi or rales.  Chest:     Chest wall: No tenderness.  Abdominal:     General: Abdomen is flat. Bowel sounds are normal. There is no distension.     Palpations: Abdomen is soft.     Tenderness: There is no abdominal tenderness. There is no right CVA tenderness, left CVA tenderness, guarding or rebound.     Hernia: No hernia is present.  Musculoskeletal:     Cervical back: Normal range of motion. No rigidity or tenderness.     Right lower leg: No edema.     Left lower leg: No edema.  Lymphadenopathy:     Cervical: No cervical adenopathy.  Skin:    General: Skin is  warm.     Coloration: Skin is not jaundiced or pale.     Findings: No bruising, erythema, lesion or rash.  Neurological:     General: No focal deficit present.     Mental  Status: He is alert and oriented to person, place, and time. Mental status is at baseline.     Cranial Nerves: No cranial nerve deficit.     Sensory: No sensory deficit.     Motor: No weakness.     Coordination: Coordination normal.     Gait: Gait normal.     Deep Tendon Reflexes: Reflexes normal.  Psychiatric:        Mood and Affect: Mood normal.        Behavior: Behavior normal.        Thought Content: Thought content normal.        Judgment: Judgment normal.           Assessment & Plan:  Colon cancer screening - Plan: Ambulatory referral to Gastroenterology  Insomnia, unspecified type - Plan: zolpidem (AMBIEN) 10 MG tablet  Prostate cancer screening - Plan: PSA  General medical exam - Plan: TSH, Lipid panel, Comprehensive metabolic panel, CBC with Differential/Platelet, Ambulatory referral to Gastroenterology, PSA  Encounter for screening for lung cancer - Plan: CT CHEST LUNG CA SCREEN LOW DOSE W/O CM  Patient's physical exam is relatively normal today.  I will contact the gastroenterologist to get the patient scheduled for colonoscopy.  I will screen for prostate cancer with a PSA.  Regarding his family history of hypothyroidism I will check a TSH.  Also check CBC, CMP, lipid panel.  Screen the patient for lung cancer given the fact that he is over 69 with a 28-pack-year history of smoking.  Encourage smoking cessation.  Sent in a refill on Ambien 10 mg p.o. nightly as needed insomnia been encouraged the patient to use it sparingly to avoid habituation and dependency.  Otherwise await the results of his lab work.

## 2020-07-02 LAB — LIPID PANEL
Cholesterol: 243 mg/dL — ABNORMAL HIGH (ref <200)
HDL: 33 mg/dL — ABNORMAL LOW (ref >=40)
LDL Cholesterol (Calc): 171 mg/dL (calc) — ABNORMAL HIGH
Non-HDL Cholesterol (Calc): 210 mg/dL (calc) — ABNORMAL HIGH (ref <130)
Total CHOL/HDL Ratio: 7.4 (calc) — ABNORMAL HIGH (ref <5.0)
Triglycerides: 233 mg/dL — ABNORMAL HIGH (ref <150)

## 2020-07-02 LAB — CBC WITH DIFFERENTIAL/PLATELET
Absolute Monocytes: 600 cells/uL (ref 200–950)
Basophils Absolute: 69 cells/uL (ref 0–200)
Basophils Relative: 1 %
Eosinophils Absolute: 131 cells/uL (ref 15–500)
Eosinophils Relative: 1.9 %
HCT: 48.1 % (ref 38.5–50.0)
Hemoglobin: 16.5 g/dL (ref 13.2–17.1)
Lymphs Abs: 2049 cells/uL (ref 850–3900)
MCH: 32 pg (ref 27.0–33.0)
MCHC: 34.3 g/dL (ref 32.0–36.0)
MCV: 93.2 fL (ref 80.0–100.0)
MPV: 11.2 fL (ref 7.5–12.5)
Monocytes Relative: 8.7 %
Neutro Abs: 4050 cells/uL (ref 1500–7800)
Neutrophils Relative %: 58.7 %
Platelets: 229 10*3/uL (ref 140–400)
RBC: 5.16 10*6/uL (ref 4.20–5.80)
RDW: 12.5 % (ref 11.0–15.0)
Total Lymphocyte: 29.7 %
WBC: 6.9 10*3/uL (ref 3.8–10.8)

## 2020-07-02 LAB — COMPREHENSIVE METABOLIC PANEL
AG Ratio: 1.8 (calc) (ref 1.0–2.5)
ALT: 32 U/L (ref 9–46)
AST: 20 U/L (ref 10–35)
Albumin: 4.7 g/dL (ref 3.6–5.1)
Alkaline phosphatase (APISO): 86 U/L (ref 35–144)
BUN: 18 mg/dL (ref 7–25)
CO2: 24 mmol/L (ref 20–32)
Calcium: 9.8 mg/dL (ref 8.6–10.3)
Chloride: 106 mmol/L (ref 98–110)
Creat: 1.15 mg/dL (ref 0.70–1.33)
Globulin: 2.6 g/dL (calc) (ref 1.9–3.7)
Glucose, Bld: 105 mg/dL — ABNORMAL HIGH (ref 65–99)
Potassium: 4.2 mmol/L (ref 3.5–5.3)
Sodium: 139 mmol/L (ref 135–146)
Total Bilirubin: 0.5 mg/dL (ref 0.2–1.2)
Total Protein: 7.3 g/dL (ref 6.1–8.1)

## 2020-07-02 LAB — TSH: TSH: 2.77 mIU/L (ref 0.40–4.50)

## 2020-07-02 LAB — PSA: PSA: 0.44 ng/mL (ref <=4.0)

## 2020-07-07 ENCOUNTER — Other Ambulatory Visit: Payer: Self-pay | Admitting: *Deleted

## 2020-07-07 MED ORDER — ATORVASTATIN CALCIUM 20 MG PO TABS: 20.0000 mg | ORAL_TABLET | Freq: Every day | ORAL | 0 refills | Status: DC

## 2020-07-10 ENCOUNTER — Encounter: Payer: Self-pay | Admitting: Gastroenterology

## 2020-07-18 ENCOUNTER — Telehealth: Payer: Self-pay

## 2020-07-18 NOTE — Telephone Encounter (Signed)
Received communication from [facility] for notice of approval for request of medicaid. Form has been sent to scan

## 2020-07-29 ENCOUNTER — Ambulatory Visit (HOSPITAL_COMMUNITY): Payer: Medicaid Other

## 2020-09-02 ENCOUNTER — Ambulatory Visit (AMBULATORY_SURGERY_CENTER): Payer: Self-pay | Admitting: *Deleted

## 2020-09-02 ENCOUNTER — Other Ambulatory Visit: Payer: Self-pay

## 2020-09-02 VITALS — Ht 72.0 in | Wt 265.0 lb

## 2020-09-02 DIAGNOSIS — Z1211 Encounter for screening for malignant neoplasm of colon: Secondary | ICD-10-CM

## 2020-09-02 MED ORDER — SUPREP BOWEL PREP KIT 17.5-3.13-1.6 GM/177ML PO SOLN: 1.0000 | Freq: Once | ORAL | 0 refills | Status: AC

## 2020-09-02 NOTE — Progress Notes (Signed)

## 2020-09-15 ENCOUNTER — Other Ambulatory Visit: Payer: Self-pay

## 2020-09-15 ENCOUNTER — Ambulatory Visit (AMBULATORY_SURGERY_CENTER): Payer: Medicaid Other | Admitting: Gastroenterology

## 2020-09-15 ENCOUNTER — Encounter: Payer: Self-pay | Admitting: Gastroenterology

## 2020-09-15 VITALS — BP 128/80 | HR 63 | Temp 97.7°F | Resp 16 | Ht 72.0 in | Wt 265.0 lb

## 2020-09-15 DIAGNOSIS — D123 Benign neoplasm of transverse colon: Secondary | ICD-10-CM

## 2020-09-15 DIAGNOSIS — Z1211 Encounter for screening for malignant neoplasm of colon: Secondary | ICD-10-CM | POA: Diagnosis not present

## 2020-09-15 DIAGNOSIS — D122 Benign neoplasm of ascending colon: Secondary | ICD-10-CM

## 2020-09-15 DIAGNOSIS — D125 Benign neoplasm of sigmoid colon: Secondary | ICD-10-CM | POA: Diagnosis not present

## 2020-09-15 DIAGNOSIS — D120 Benign neoplasm of cecum: Secondary | ICD-10-CM

## 2020-09-15 DIAGNOSIS — D124 Benign neoplasm of descending colon: Secondary | ICD-10-CM | POA: Diagnosis not present

## 2020-09-15 DIAGNOSIS — D128 Benign neoplasm of rectum: Secondary | ICD-10-CM | POA: Diagnosis not present

## 2020-09-15 MED ORDER — SODIUM CHLORIDE 0.9 % IV SOLN
500.0000 mL | Freq: Once | INTRAVENOUS | Status: DC
Start: 2020-09-15 — End: 2023-02-25

## 2020-09-15 NOTE — Progress Notes (Signed)
Called to room to assist during endoscopic procedure.  Patient ID and intended procedure confirmed with present staff. Received instructions for my participation in the procedure from the performing physician.  

## 2020-09-15 NOTE — Progress Notes (Signed)
Vs by CW in adm  Pt's states no medical or surgical changes since previsit or office visit.     

## 2020-09-15 NOTE — Patient Instructions (Addendum)
YOU HAD AN ENDOSCOPIC PROCEDURE TODAY AT [facility]:   Refer to the procedure report that was given to you for any specific questions about what was found during the examination.  If the procedure report does not answer your questions, please call your gastroenterologist to clarify.  If you requested that your care partner not be given the details of your procedure findings, then the procedure report has been included in a sealed envelope for you to review at your convenience later.  YOU SHOULD EXPECT: Some feelings of bloating in the abdomen. Passage of more gas than usual.  Walking can help get rid of the air that was put into your GI tract during the procedure and reduce the bloating. If you had a lower endoscopy (such as a colonoscopy or flexible sigmoidoscopy) you may notice spotting of blood in your stool or on the toilet paper. If you underwent a bowel prep for your procedure, you may not have a normal bowel movement for a few days.  Please Note:  You might notice some irritation and congestion in your nose or some drainage.  This is from the oxygen used during your procedure.  There is no need for concern and it should clear up in a day or so.  SYMPTOMS TO REPORT IMMEDIATELY:   Following lower endoscopy (colonoscopy or flexible sigmoidoscopy):  Excessive amounts of blood in the stool  Significant tenderness or worsening of abdominal pains  Swelling of the abdomen that is new, acute  Fever of 100F or higher    For urgent or emergent issues, a gastroenterologist can be reached at any hour by calling [phone]. Do not use MyChart messaging for urgent concerns.    DIET:  We do recommend a small meal at first, but then you may proceed to your regular diet.  Drink plenty of fluids but you should avoid alcoholic beverages for 24 hours.  MEDICATIONS: Continue present medications. No nonsteroidal anti-inflammatory drugs (such as ibuprofen, motrin, alleve, naproxen, etc.)  for 2 weeks.  FOLLOW UP: Repeat colonoscopy in 1 year for surveillance based on pathology results.  Please see handouts given to you by your recovery nurse.  Thank you for allowing [place] to provide for your healthcare needs today.  ACTIVITY:  You should plan to take it easy for the rest of today and you should NOT DRIVE or use heavy machinery until tomorrow (because of the sedation medicines used during the test).    FOLLOW UP: Our staff will call the number listed on your records 48-72 hours following your procedure to check on you and address any questions or concerns that you may have regarding the information given to you following your procedure. If we do not reach you, we will leave a message.  We will attempt to reach you two times.  During this call, we will ask if you have developed any symptoms of COVID 19. If you develop any symptoms (ie: fever, flu-like symptoms, shortness of breath, cough etc.) before then, please call [phone].  If you test positive for Covid 19 in the 2 weeks post procedure, please call and report this information to [place].    If any biopsies were taken you will be contacted by phone or by letter within the next 1-3 weeks.  Please call us at [phone] if you have not heard about the biopsies in 3 weeks.    SIGNATURES/CONFIDENTIALITY: You and/or your care partner have signed paperwork which will be entered into your electronic medical  record.  These signatures attest to the fact that that the information above on your After Visit Summary has been reviewed and is understood.  Full responsibility of the confidentiality of this discharge information lies with you and/or your care-partner. 

## 2020-09-15 NOTE — Op Note (Addendum)
[facility] Patient Name: Rick Kim Procedure Date: 09/15/2020 9:19 AM MRN: [id number] Endoscopist: Mauri Pole , MD Age: 50 Referring MD:  Date of Birth: [birth year] Gender: Male Account #: [id number] Procedure:                Colonoscopy Indications:              Screening for colorectal malignant neoplasm Medicines:                Monitored Anesthesia Care Procedure:                Pre-Anesthesia Assessment:                           - Prior to the procedure, a History and Physical                            was performed, and patient medications and                            allergies were reviewed. The patient's tolerance of                            previous anesthesia was also reviewed. The risks                            and benefits of the procedure and the sedation                            options and risks were discussed with the patient.                            All questions were answered, and informed consent                            was obtained. Prior Anticoagulants: The patient has                            taken no previous anticoagulant or antiplatelet                            agents. ASA Grade Assessment: III - A patient with                            severe systemic disease. After reviewing the risks                            and benefits, the patient was deemed in                            satisfactory condition to undergo the procedure.                           After obtaining informed consent, the colonoscope  was passed under direct vision. Throughout the                            procedure, the patient's blood pressure, pulse, and                            oxygen saturations were monitored continuously. The                            colonoscopy was performed without difficulty. The                            patient tolerated the procedure well. The quality                            of the  bowel preparation was excellent. The                            ileocecal valve, appendiceal orifice, and rectum                            were photographed. The Olympus PCF-H190DL                            (#[id number]) Colonoscope was introduced through the                            anus and advanced to the the cecum, identified by                            appendiceal orifice and ileocecal valve. Scope In: 9:25:13 AM Scope Out: 9:53:01 AM Scope Withdrawal Time: 0 hours 24 minutes 21 seconds  Total Procedure Duration: 0 hours 27 minutes 48 seconds  Findings:                 The perianal and digital rectal examinations were                            normal.                           A less than 1 mm polyp was found in the ascending                            colon. The polyp was sessile. The polyp was removed                            with a cold biopsy forceps. Resection and retrieval                            were complete.                           Three semi-pedunculated polyps were found in the  recto-sigmoid colon X1 and sigmoid colon X2. The                            polyps were 12 to 15 mm in size. These polyps were                            removed with a hot snare. Resection and retrieval                            were complete.                           15 sessile polyps were found in the rectum X3,                            sigmoid colon X1, transverse colon X10 and cecum                            X1. The polyps were 4 to 11 mm in size. These                            polyps were removed with a cold snare. Resection                            and retrieval were complete.                           Non-bleeding external and internal hemorrhoids were                            found during retroflexion. The hemorrhoids were                            medium-sized.                           The exam was otherwise without  abnormality. Complications:            No immediate complications. Estimated Blood Loss:     Estimated blood loss was minimal. Impression:               - One less than 1 mm polyp in the ascending colon,                            removed with a cold biopsy forceps. Resected and                            retrieved.                           - Three 12 to 15 mm polyps at the recto-sigmoid                            colon and in the sigmoid colon, removed with a hot  snare. Resected and retrieved.                           - Fifteen 4 to 11 mm polyps in the rectum, in the                            sigmoid colon, in the transverse colon and in the                            cecum, removed with a cold snare. Resected and                            retrieved.                           - Non-bleeding external and internal hemorrhoids.                           - The examination was otherwise normal. Recommendation:           - Patient has a contact number available for                            emergencies. The signs and symptoms of potential                            delayed complications were discussed with the                            patient. Return to normal activities tomorrow.                            Written discharge instructions were provided to the                            patient.                           - Resume previous diet.                           - Continue present medications.                           - Await pathology results.                           - Repeat colonoscopy in 1 year for surveillance                            based on pathology results. Mauri Pole, MD 09/15/2020 9:58:56 AM This report has been signed electronically.

## 2020-09-15 NOTE — Progress Notes (Signed)
Report given to PACU, vss 

## 2020-09-17 ENCOUNTER — Telehealth: Payer: Self-pay

## 2020-09-17 NOTE — Telephone Encounter (Signed)
LVM

## 2020-10-03 ENCOUNTER — Encounter: Payer: Self-pay | Admitting: Gastroenterology

## 2020-10-09 ENCOUNTER — Other Ambulatory Visit: Payer: Self-pay | Admitting: Family Medicine

## 2021-10-06 ENCOUNTER — Ambulatory Visit: Payer: Medicaid Other | Admitting: Family Medicine

## 2021-10-06 VITALS — BP 132/82 | HR 82 | Temp 97.6°F | Ht 73.0 in | Wt 267.0 lb

## 2021-10-06 DIAGNOSIS — E755 Other lipid storage disorders: Secondary | ICD-10-CM | POA: Diagnosis not present

## 2021-10-06 DIAGNOSIS — E7800 Pure hypercholesterolemia, unspecified: Secondary | ICD-10-CM | POA: Diagnosis not present

## 2021-10-06 MED ORDER — ATORVASTATIN CALCIUM 40 MG PO TABS: 40.0000 mg | ORAL_TABLET | Freq: Every day | ORAL | 3 refills | Status: DC

## 2021-10-06 NOTE — Progress Notes (Signed)
Subjective:    Patient ID: Rick Kim, male    DOB: 1970-08-20, 51 y.o.   MRN: [id number]  HPI Patient is a very pleasant 51 year old Caucasian gentleman who presents today requesting referral to dermatology to have "growths" removed from above his eyelids.  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  There is subcutaneous accumulations of yellow fat and cholesterol.  I explained to the patient what they are.  He has a history of hyperlipidemia however he states that he has been out of the cholesterol medicine for about a month.  He also smokes.  He denies any chest pain or shortness of breath.  He denies any claudication.  He has no carotid bruits today on exam Past Medical History:  Diagnosis Date   Anxiety    Depression    Disorder of lumbar spine    GERD (gastroesophageal reflux disease)    diet related    Hyperlipidemia    Neuromuscular disorder ([place])    Smoker    Past Surgical History:  Procedure Laterality Date   CARPAL TUNNEL RELEASE Left 09/23/2016   Procedure: CARPAL TUNNEL RELEASE;  Surgeon: Ashok Pall, MD;  Location: [facility];  Service: Neurosurgery;  Laterality: Left;  left   MULTIPLE TOOTH EXTRACTIONS  2016   under light sedation   WISDOM TOOTH EXTRACTION     age 51    Current Outpatient Medications on File Prior to Visit  Medication Sig Dispense Refill   buPROPion (WELLBUTRIN SR) 150 MG 12 hr tablet Take 1 daily for 5 days then take 1 twice a day 60 tablet 5   zolpidem (AMBIEN) 10 MG tablet Take 1 tablet (10 mg total) by mouth at bedtime as needed. for sleep 30 tablet 4   Current Facility-Administered Medications on File Prior to Visit  Medication Dose Route Frequency Provider Last Rate Last Admin   0.9 %  sodium chloride infusion  500 mL Intravenous Once Nandigam, Venia Minks, MD       Allergies  Allergen Reactions   No Known Allergies    Social History   Socioeconomic History   Marital status: Married    Spouse name: Not on file    Number of children: Not on file   Years of education: Not on file   Highest education level: Not on file  Occupational History   Not on file  Tobacco Use   Smoking status: Every Day    Packs/day: 1.00    Types: Cigarettes   Smokeless tobacco: Never  Vaping Use   Vaping Use: Never used  Substance and Sexual Activity   Alcohol use: Yes    Comment: socially   Drug use: No   Sexual activity: Yes  Other Topics Concern   Not on file  Social History Narrative   Entered 37:   Married.   4 kids -- Ages 73 y/o, 86 y/o, Twins 16 y/o   2 older kids from different marriage.    Twins from new marriage --this wife had never had kids until these twins.   Works on [employer].   He walks around neighborhood routinely and rides bike.         Social Determinants of Health   Financial Resource Strain: Not on file  Food Insecurity: Not on file  Transportation Needs: Not on file  Physical Activity: Not on file  Stress: Not on file  Social Connections: Not on file  Intimate Partner Violence: Not on file      Review  of Systems  All other systems reviewed and are negative.     Objective:   Physical Exam Vitals reviewed.  Constitutional:      Appearance: Normal appearance.  Eyes:   Cardiovascular:     Rate and Rhythm: Normal rate and regular rhythm.     Heart sounds: Normal heart sounds. No murmur heard.   No friction rub. No gallop.  Pulmonary:     Effort: Pulmonary effort is normal. No respiratory distress.     Breath sounds: Normal breath sounds. No wheezing or rales.  Musculoskeletal:     Right lower leg: No edema.     Left lower leg: No edema.  Neurological:     Mental Status: He is alert.          Assessment & Plan:  Xanthoma - Plan: Ambulatory referral to Dermatology  Pure hypercholesterolemia - Plan: CBC with Differential/Platelet, Lipid panel, COMPLETE METABOLIC PANEL WITH GFR Patient would like referral to dermatology for excision and removal of the xanthomas for  cosmetic purposes.  I will be glad to do that for him.  However I emphasized the need to treat his cholesterol.  I want him to take Lipitor 40 mg a day to reduce his risk of cardiovascular disease.  I also strongly encouraged him to stop smoking.  Obtain baseline labs today including CBC CMP and fasting lipid panel.  We discussed a low-fat diet.

## 2021-10-07 LAB — LIPID PANEL
Cholesterol: 241 mg/dL — ABNORMAL HIGH (ref <200)
HDL: 29 mg/dL — ABNORMAL LOW (ref >=40)
Non-HDL Cholesterol (Calc): 212 mg/dL (calc) — ABNORMAL HIGH (ref <130)
Total CHOL/HDL Ratio: 8.3 (calc) — ABNORMAL HIGH (ref <5.0)
Triglycerides: 457 mg/dL — ABNORMAL HIGH (ref <150)

## 2021-10-07 LAB — COMPLETE METABOLIC PANEL WITH GFR
AG Ratio: 1.5 (calc) (ref 1.0–2.5)
ALT: 28 U/L (ref 9–46)
AST: 21 U/L (ref 10–35)
Albumin: 4.6 g/dL (ref 3.6–5.1)
Alkaline phosphatase (APISO): 89 U/L (ref 35–144)
BUN: 13 mg/dL (ref 7–25)
CO2: 25 mmol/L (ref 20–32)
Calcium: 9.8 mg/dL (ref 8.6–10.3)
Chloride: 104 mmol/L (ref 98–110)
Creat: 1.09 mg/dL (ref 0.70–1.30)
Globulin: 3 g/dL (calc) (ref 1.9–3.7)
Glucose, Bld: 90 mg/dL (ref 65–99)
Potassium: 4.4 mmol/L (ref 3.5–5.3)
Sodium: 137 mmol/L (ref 135–146)
Total Bilirubin: 0.4 mg/dL (ref 0.2–1.2)
Total Protein: 7.6 g/dL (ref 6.1–8.1)
eGFR: 82 mL/min/{1.73_m2} (ref >=60)

## 2021-10-07 LAB — CBC WITH DIFFERENTIAL/PLATELET
Absolute Monocytes: 593 cells/uL (ref 200–950)
Basophils Absolute: 84 cells/uL (ref 0–200)
Basophils Relative: 1.1 %
Eosinophils Absolute: 160 cells/uL (ref 15–500)
Eosinophils Relative: 2.1 %
HCT: 47.4 % (ref 38.5–50.0)
Hemoglobin: 17 g/dL (ref 13.2–17.1)
Lymphs Abs: 2569 cells/uL (ref 850–3900)
MCH: 32.7 pg (ref 27.0–33.0)
MCHC: 35.9 g/dL (ref 32.0–36.0)
MCV: 91.2 fL (ref 80.0–100.0)
MPV: 11.3 fL (ref 7.5–12.5)
Monocytes Relative: 7.8 %
Neutro Abs: 4195 cells/uL (ref 1500–7800)
Neutrophils Relative %: 55.2 %
Platelets: 261 10*3/uL (ref 140–400)
RBC: 5.2 10*6/uL (ref 4.20–5.80)
RDW: 12.7 % (ref 11.0–15.0)
Total Lymphocyte: 33.8 %
WBC: 7.6 10*3/uL (ref 3.8–10.8)

## 2021-11-06 ENCOUNTER — Ambulatory Visit: Payer: Medicaid Other | Admitting: Family Medicine

## 2021-11-08 ENCOUNTER — Encounter: Payer: Self-pay | Admitting: Gastroenterology

## 2021-12-07 ENCOUNTER — Encounter: Payer: Self-pay | Admitting: Gastroenterology

## 2021-12-29 ENCOUNTER — Ambulatory Visit (AMBULATORY_SURGERY_CENTER): Payer: Self-pay | Admitting: *Deleted

## 2021-12-29 VITALS — Ht 73.0 in | Wt 269.0 lb

## 2021-12-29 DIAGNOSIS — Z86010 Personal history of colonic polyps: Secondary | ICD-10-CM

## 2021-12-29 MED ORDER — PLENVU 140 G PO SOLR: 1.0000 | ORAL | 0 refills | Status: AC

## 2021-12-29 NOTE — Progress Notes (Signed)
No egg or soy allergy known to patient  No issues known to pt with past sedation with any surgeries or procedures Patient denies ever being told they had issues or difficulty with intubation  No FH of Malignant Hyperthermia Pt is not on diet pills Pt is not on  home 02  Pt is not on blood thinners  Pt denies issues with constipation  No A fib or A flutter   Discussed with pt there will be an out-of-pocket cost for prep and that varies from $0 to 70 +  dollars - pt verbalized understanding    PV completed in person. Pt verified name, DOB.  Procedure explained to pt. Prep instructions reviewed, questions answered. Pt encouraged to call with questions or issues.  If pt has My chart, procedure instructions sent via My Chart

## 2022-01-08 ENCOUNTER — Other Ambulatory Visit: Payer: Medicaid Other

## 2022-01-08 DIAGNOSIS — E7800 Pure hypercholesterolemia, unspecified: Secondary | ICD-10-CM

## 2022-01-11 ENCOUNTER — Other Ambulatory Visit: Payer: Medicaid Other

## 2022-01-11 DIAGNOSIS — Z79899 Other long term (current) drug therapy: Secondary | ICD-10-CM

## 2022-01-11 DIAGNOSIS — Z125 Encounter for screening for malignant neoplasm of prostate: Secondary | ICD-10-CM

## 2022-01-11 DIAGNOSIS — E7800 Pure hypercholesterolemia, unspecified: Secondary | ICD-10-CM

## 2022-01-12 LAB — CBC WITH DIFFERENTIAL/PLATELET
Absolute Monocytes: 629 cells/uL (ref 200–950)
Basophils Absolute: 67 cells/uL (ref 0–200)
Basophils Relative: 0.9 %
Eosinophils Absolute: 200 cells/uL (ref 15–500)
Eosinophils Relative: 2.7 %
HCT: 44.1 % (ref 38.5–50.0)
Hemoglobin: 15.5 g/dL (ref 13.2–17.1)
Lymphs Abs: 2435 cells/uL (ref 850–3900)
MCH: 32 pg (ref 27.0–33.0)
MCHC: 35.1 g/dL (ref 32.0–36.0)
MCV: 90.9 fL (ref 80.0–100.0)
MPV: 11.3 fL (ref 7.5–12.5)
Monocytes Relative: 8.5 %
Neutro Abs: 4070 cells/uL (ref 1500–7800)
Neutrophils Relative %: 55 %
Platelets: 226 10*3/uL (ref 140–400)
RBC: 4.85 10*6/uL (ref 4.20–5.80)
RDW: 12.4 % (ref 11.0–15.0)
Total Lymphocyte: 32.9 %
WBC: 7.4 10*3/uL (ref 3.8–10.8)

## 2022-01-12 LAB — COMPREHENSIVE METABOLIC PANEL
AG Ratio: 1.6 (calc) (ref 1.0–2.5)
ALT: 32 U/L (ref 9–46)
AST: 21 U/L (ref 10–35)
Albumin: 4.2 g/dL (ref 3.6–5.1)
Alkaline phosphatase (APISO): 83 U/L (ref 35–144)
BUN: 14 mg/dL (ref 7–25)
CO2: 24 mmol/L (ref 20–32)
Calcium: 9.3 mg/dL (ref 8.6–10.3)
Chloride: 108 mmol/L (ref 98–110)
Creat: 1.12 mg/dL (ref 0.70–1.30)
Globulin: 2.6 g/dL (calc) (ref 1.9–3.7)
Glucose, Bld: 119 mg/dL — ABNORMAL HIGH (ref 65–99)
Potassium: 4 mmol/L (ref 3.5–5.3)
Sodium: 140 mmol/L (ref 135–146)
Total Bilirubin: 0.6 mg/dL (ref 0.2–1.2)
Total Protein: 6.8 g/dL (ref 6.1–8.1)

## 2022-01-12 LAB — LIPID PANEL
Cholesterol: 187 mg/dL (ref <200)
HDL: 28 mg/dL — ABNORMAL LOW (ref >=40)
LDL Cholesterol (Calc): 122 mg/dL (calc) — ABNORMAL HIGH
Non-HDL Cholesterol (Calc): 159 mg/dL (calc) — ABNORMAL HIGH (ref <130)
Total CHOL/HDL Ratio: 6.7 (calc) — ABNORMAL HIGH (ref <5.0)
Triglycerides: 245 mg/dL — ABNORMAL HIGH (ref <150)

## 2022-01-12 LAB — PSA: PSA: 0.44 ng/mL (ref <=4.00)

## 2022-01-12 LAB — TSH: TSH: 3.6 mIU/L (ref 0.40–4.50)

## 2022-01-14 ENCOUNTER — Other Ambulatory Visit: Payer: Self-pay

## 2022-01-14 DIAGNOSIS — E7800 Pure hypercholesterolemia, unspecified: Secondary | ICD-10-CM

## 2022-01-14 MED ORDER — OMEGA-3-ACID ETHYL ESTERS 1 G PO CAPS: 2.0000 g | ORAL_CAPSULE | Freq: Two times a day (BID) | ORAL | 3 refills | Status: DC

## 2022-01-15 ENCOUNTER — Telehealth: Payer: Self-pay

## 2022-01-15 NOTE — Telephone Encounter (Signed)
LVM for pt to return call re lab results.

## 2022-01-20 ENCOUNTER — Telehealth: Payer: Self-pay | Admitting: Family Medicine

## 2022-01-20 DIAGNOSIS — E7800 Pure hypercholesterolemia, unspecified: Secondary | ICD-10-CM

## 2022-01-20 MED ORDER — OMEGA-3-ACID ETHYL ESTERS 1 G PO CAPS: 2.0000 g | ORAL_CAPSULE | Freq: Two times a day (BID) | ORAL | 3 refills | Status: AC

## 2022-01-20 NOTE — Telephone Encounter (Addendum)
Med qty# been corrected and resend to pharmacy  Call and spoke w/pt and is aware that has been sent to pharmacy.

## 2022-01-20 NOTE — Telephone Encounter (Signed)
Received e-fax from pharmacy to request refill of  omega-3 acid ethyl esters (LOVAZA) 1 g capsule   Pharmacy:  [facility] [place], [place] - [address]  [address] [place], [place] [address]  Phone:  [phone]  Fax:  [phone]  DEA #:  [id number]  LOV: 10/06/21  Please advise pharmacist at  [phone]

## 2022-01-20 NOTE — Telephone Encounter (Signed)
[facility] called and spoke to [place], [facility] about the refill(s) omega-3 requested. Advised it was sent on 01/14/22 #30/3 refill(s). He says the issue is that the instructions say to take 2 caps BID with #30, which will last only 7 days. This will need to be corrected and another Rx sent in.

## 2022-01-25 ENCOUNTER — Telehealth: Payer: Self-pay

## 2022-01-25 NOTE — Telephone Encounter (Signed)
Error. See above encounter.

## 2022-01-25 NOTE — Telephone Encounter (Signed)
Rec' fax from [facility] requesting for an alternative for pt's Omgega-3 Acid 1GM Capsules due to pt insurance does not cover meds.   Spoke w/Pt that he can try Fish Olil OTC capsules 1000GM, take 2 capsules by mouth twice daily instead.  Also 'ok' per Dr. Dennard Schaumann.   Pt voiced understanding and nothing further.

## 2022-01-26 ENCOUNTER — Encounter: Payer: Self-pay | Admitting: Gastroenterology

## 2022-01-26 ENCOUNTER — Ambulatory Visit (AMBULATORY_SURGERY_CENTER): Payer: Medicaid Other | Admitting: Gastroenterology

## 2022-01-26 VITALS — BP 124/82 | HR 61 | Temp 97.8°F | Resp 15 | Ht 73.0 in | Wt 269.0 lb

## 2022-01-26 DIAGNOSIS — Z09 Encounter for follow-up examination after completed treatment for conditions other than malignant neoplasm: Secondary | ICD-10-CM

## 2022-01-26 DIAGNOSIS — D125 Benign neoplasm of sigmoid colon: Secondary | ICD-10-CM

## 2022-01-26 DIAGNOSIS — D128 Benign neoplasm of rectum: Secondary | ICD-10-CM

## 2022-01-26 DIAGNOSIS — D123 Benign neoplasm of transverse colon: Secondary | ICD-10-CM

## 2022-01-26 DIAGNOSIS — Z86010 Personal history of colonic polyps: Secondary | ICD-10-CM | POA: Diagnosis not present

## 2022-01-26 DIAGNOSIS — D127 Benign neoplasm of rectosigmoid junction: Secondary | ICD-10-CM

## 2022-01-26 MED ORDER — SODIUM CHLORIDE 0.9 % IV SOLN: 500.0000 mL | INTRAVENOUS | Status: DC

## 2022-01-26 NOTE — Progress Notes (Signed)
PT taken to PACU. Monitors in place. VSS. Report given to RN. 

## 2022-01-26 NOTE — Progress Notes (Signed)
Called to room to assist during endoscopic procedure.  Patient ID and intended procedure confirmed with present staff. Received instructions for my participation in the procedure from the performing physician.  

## 2022-01-26 NOTE — Patient Instructions (Signed)
Discharge instructions given. Handouts on polyps,diverticulosis and hemorrhoids. Resume previous medications. YOU HAD AN ENDOSCOPIC PROCEDURE TODAY AT THE [facility]:   Refer to the procedure report that was given to you for any specific questions about what was found during the examination.  If the procedure report does not answer your questions, please call your gastroenterologist to clarify.  If you requested that your care partner not be given the details of your procedure findings, then the procedure report has been included in a sealed envelope for you to review at your convenience later.  YOU SHOULD EXPECT: Some feelings of bloating in the abdomen. Passage of more gas than usual.  Walking can help get rid of the air that was put into your GI tract during the procedure and reduce the bloating. If you had a lower endoscopy (such as a colonoscopy or flexible sigmoidoscopy) you may notice spotting of blood in your stool or on the toilet paper. If you underwent a bowel prep for your procedure, you may not have a normal bowel movement for a few days.  Please Note:  You might notice some irritation and congestion in your nose or some drainage.  This is from the oxygen used during your procedure.  There is no need for concern and it should clear up in a day or so.  SYMPTOMS TO REPORT IMMEDIATELY:  Following lower endoscopy (colonoscopy or flexible sigmoidoscopy):  Excessive amounts of blood in the stool  Significant tenderness or worsening of abdominal pains  Swelling of the abdomen that is new, acute  Fever of 100F or higher   For urgent or emergent issues, a gastroenterologist can be reached at any hour by calling [phone]. Do not use MyChart messaging for urgent concerns.    DIET:  We do recommend a small meal at first, but then you may proceed to your regular diet.  Drink plenty of fluids but you should avoid alcoholic beverages for 24 hours.  ACTIVITY:  You should  plan to take it easy for the rest of today and you should NOT DRIVE or use heavy machinery until tomorrow (because of the sedation medicines used during the test).    FOLLOW UP: Our staff will call the number listed on your records the next business day following your procedure.  We will call around 7:15- 8:00 am to check on you and address any questions or concerns that you may have regarding the information given to you following your procedure. If we do not reach you, we will leave a message.     If any biopsies were taken you will be contacted by phone or by letter within the next 1-3 weeks.  Please call us at [phone] if you have not heard about the biopsies in 3 weeks.    SIGNATURES/CONFIDENTIALITY: You and/or your care partner have signed paperwork which will be entered into your electronic medical record.  These signatures attest to the fact that that the information above on your After Visit Summary has been reviewed and is understood.  Full responsibility of the confidentiality of this discharge information lies with you and/or your care-partner. 

## 2022-01-26 NOTE — Progress Notes (Signed)
Pt's states no medical or surgical changes since previsit or office visit. 

## 2022-01-26 NOTE — Op Note (Signed)
[facility] Patient Name: Rick Kim Procedure Date: 01/26/2022 12:01 PM MRN: [id number] Endoscopist: Mauri Pole , MD Age: 51 Referring MD:  Date of Birth: 07-Apr-1971 Gender: Male Account #: [id number] Procedure:                Colonoscopy Indications:              High risk colon cancer surveillance: Personal                            history of colonic polyps, Surveillance: History of                            numerous (> 10) adenomas on last colonoscopy (< 3                            yrs), High risk colon cancer surveillance: Personal                            history of adenoma less than 10 mm in size Medicines:                Monitored Anesthesia Care Procedure:                Pre-Anesthesia Assessment:                           - Prior to the procedure, a History and Physical                            was performed, and patient medications and                            allergies were reviewed. The patient's tolerance of                            previous anesthesia was also reviewed. The risks                            and benefits of the procedure and the sedation                            options and risks were discussed with the patient.                            All questions were answered, and informed consent                            was obtained. Prior Anticoagulants: The patient has                            taken no previous anticoagulant or antiplatelet                            agents. ASA Grade Assessment: II - A patient with  mild systemic disease. After reviewing the risks                            and benefits, the patient was deemed in                            satisfactory condition to undergo the procedure.                           After obtaining informed consent, the colonoscope                            was passed under direct vision. Throughout the                            procedure, the  patient's blood pressure, pulse, and                            oxygen saturations were monitored continuously. The                            Olympus PCF-H190DL (#[id number]) Colonoscope was                            introduced through the anus and advanced to the the                            cecum, identified by the appendiceal orifice. The                            colonoscopy was performed without difficulty. The                            patient tolerated the procedure well. The quality                            of the bowel preparation was good. The ileocecal                            valve, appendiceal orifice, and rectum were                            photographed. Scope In: 12:04:58 PM Scope Out: 12:19:35 PM Scope Withdrawal Time: 0 hours 11 minutes 52 seconds  Total Procedure Duration: 0 hours 14 minutes 37 seconds  Findings:                 The perianal and digital rectal examinations were                            normal.                           Seven sessile polyps were found in the rectum,  sigmoid colon and transverse colon. The polyps were                            3 to 7 mm in size. These polyps were removed with a                            cold snare. Resection and retrieval were complete.                           A few small-mouthed diverticula were found in the                            sigmoid colon.                           External and internal hemorrhoids were found during                            retroflexion. The hemorrhoids were medium-sized. Complications:            No immediate complications. Estimated Blood Loss:     Estimated blood loss was minimal. Impression:               - Seven 3 to 7 mm polyps in the rectum, in the                            sigmoid colon and in the transverse colon, removed                            with a cold snare. Resected and retrieved.                           - Diverticulosis in the  sigmoid colon.                           - External and internal hemorrhoids. Recommendation:           - Patient has a contact number available for                            emergencies. The signs and symptoms of potential                            delayed complications were discussed with the                            patient. Return to normal activities tomorrow.                            Written discharge instructions were provided to the                            patient.                           -  Resume previous diet.                           - Continue present medications.                           - Await pathology results.                           - Repeat colonoscopy in 3 years for surveillance                            based on pathology results. Mauri Pole, MD 01/26/2022 12:24:35 PM This report has been signed electronically.

## 2022-01-26 NOTE — Progress Notes (Signed)
Rick Kim   Primary Care Physician:  Rick Frizzle, Kim   Reason for Procedure:  History of adenomatous colon polyps  Plan:    Surveillance colonoscopy with possible interventions as needed     HPI: [name] is a very pleasant 51 y.o. male here for surveillance colonoscopy. Denies any nausea, vomiting, abdominal pain, melena or bright red blood per rectum  The risks and benefits as well as alternatives of endoscopic procedure(s) have been discussed and reviewed. All questions answered. The patient agrees to proceed.    Past Medical History:  Diagnosis Date   Anxiety    Depression    Disorder of lumbar spine    GERD (gastroesophageal reflux disease)    diet related    Hyperlipidemia    Neuromuscular disorder ([place])    Smoker     Past Surgical History:  Procedure Laterality Date   CARPAL TUNNEL RELEASE Left 09/23/2016   Procedure: CARPAL TUNNEL RELEASE;  Surgeon: Rick Pall, Kim;  Location: [place];  Service: Neurosurgery;  Laterality: Left;  left   MULTIPLE TOOTH EXTRACTIONS  2016   under light sedation   WISDOM TOOTH EXTRACTION     age 86     Prior to Admission medications   Medication Sig Start Date End Date Taking? Authorizing Provider  atorvastatin (LIPITOR) 40 MG tablet Take 1 tablet (40 mg total) by mouth daily. 10/06/21  Yes Rick Frizzle, Kim  PEG-KCl-NaCl-NaSulf-Na Asc-C (PLENVU) 140 g SOLR Take 1 kit by mouth as directed. 12/29/21  Yes Rick Kim, Venia Minks, Kim  zolpidem (AMBIEN) 10 MG tablet Take 1 tablet (10 mg total) by mouth at bedtime as needed. for sleep 07/01/20  Yes Rick Frizzle, Kim  buPROPion [facility] SR) 150 MG 12 hr tablet Take 1 daily for 5 days then take 1 twice a day Patient not taking: Reported on 12/29/2021 09/09/16   Rick Billet B, PA-C  omega-3 acid ethyl esters (LOVAZA) 1 g capsule Take 2 capsules (2 g total) by mouth 2 (two) times daily. 01/20/22   Rick Frizzle, Kim    Current  Outpatient Medications  Medication Sig Dispense Refill   atorvastatin (LIPITOR) 40 MG tablet Take 1 tablet (40 mg total) by mouth daily. 90 tablet 3   PEG-KCl-NaCl-NaSulf-Na Asc-C (PLENVU) 140 g SOLR Take 1 kit by mouth as directed. 1 each 0   zolpidem (AMBIEN) 10 MG tablet Take 1 tablet (10 mg total) by mouth at bedtime as needed. for sleep 30 tablet 4   buPROPion (WELLBUTRIN SR) 150 MG 12 hr tablet Take 1 daily for 5 days then take 1 twice a day (Patient not taking: Reported on 12/29/2021) 60 tablet 5   omega-3 acid ethyl esters (LOVAZA) 1 g capsule Take 2 capsules (2 g total) by mouth 2 (two) times daily. 120 capsule 3   Current Facility-Administered Medications  Medication Dose Route Frequency Provider Last Rate Last Admin   0.9 %  sodium chloride infusion  500 mL Intravenous Once Rick Carrico Kim, Kim       0.9 %  sodium chloride infusion  500 mL Intravenous Continuous Rick Kim        Allergies as of 01/26/2022 - Review Complete 01/26/2022  Allergen Reaction Noted   No known allergies  09/22/2016    Family History  Problem Relation Age of Onset   Cancer Sister 37       Lung Cancer   Alcohol abuse Father    Colon  polyps Neg Hx    Colon cancer Neg Hx    Esophageal cancer Neg Hx    Rectal cancer Neg Hx    Stomach cancer Neg Hx     Social History   Socioeconomic History   Marital status: Married    Spouse name: Not on file   Number of children: Not on file   Years of education: Not on file   Highest education level: Not on file  Occupational History   Not on file  Tobacco Use   Smoking status: Every Day    Packs/day: 1.00    Types: Cigarettes   Smokeless tobacco: Never  Vaping Use   Vaping Use: Never used  Substance and Sexual Activity   Alcohol use: Not Currently    Comment: socially   Drug use: No   Sexual activity: Yes  Other Topics Concern   Not on file  Social History Narrative   Entered 61:   Married.   4 kids -- Ages 61 y/o, 27 y/o,  Twins 39 y/o   2 older kids from different marriage.    Twins from new marriage --this wife had never had kids until these twins.   Works on [employer].   He walks around neighborhood routinely and rides bike.         Social Determinants of Health   Financial Resource Strain: Not on file  Food Insecurity: Not on file  Transportation Needs: Not on file  Kim Activity: Not on file  Stress: Not on file  Social Connections: Not on file  Intimate Partner Violence: Not on file    Review of Systems:  All other review of systems negative except as mentioned in the HPI.  Kim Exam: Vital signs in last 24 hours: BP 127/67   Pulse 72   Temp 97.8 F (36.6 C) (Temporal)   Ht $R'6\' 1"'km$  (1.854 m)   Wt 269 lb (122 kg)   SpO2 95%   BMI 35.49 kg/m  General:   Alert, NAD Lungs:  Clear .   Heart:  Regular rate and rhythm Abdomen:  Soft, nontender and nondistended. Neuro/Psych:  Alert and cooperative. Normal mood and affect. A and O x 3  Reviewed labs, radiology imaging, old records and pertinent past GI work up  Patient is appropriate for planned procedure(s) and anesthesia in an ambulatory setting   Rick Kim , Kim [phone]

## 2022-01-27 ENCOUNTER — Telehealth: Payer: Self-pay

## 2022-01-27 NOTE — Telephone Encounter (Signed)
  Follow up Call-     01/26/2022   11:19 AM 09/15/2020    8:21 AM  Call back number  Post procedure Call Back phone  # [phone]  Permission to leave phone message Yes Yes     Patient questions:  Do you have a fever, pain , or abdominal swelling? No. Pain Score  0 *  Have you tolerated food without any problems? Yes.    Have you been able to return to your normal activities? Yes.    Do you have any questions about your discharge instructions: Diet   No. Medications  No. Follow up visit  No.  Do you have questions or concerns about your Care? No.  Actions: * If pain score is 4 or above: No action needed, pain <4.

## 2022-01-28 ENCOUNTER — Encounter: Payer: Self-pay | Admitting: Gastroenterology

## 2022-01-29 ENCOUNTER — Other Ambulatory Visit: Payer: Self-pay

## 2022-01-29 DIAGNOSIS — K635 Polyp of colon: Secondary | ICD-10-CM

## 2022-02-01 ENCOUNTER — Telehealth: Payer: Self-pay | Admitting: Genetic Counselor

## 2022-02-01 NOTE — Telephone Encounter (Signed)
Scheduled appt per 9/15 referral. Pt is aware of appt date and time. Pt is aware to arrive 15 mins prior to appt time and to bring and updated insurance card. Pt is aware of appt location.   

## 2022-03-29 ENCOUNTER — Other Ambulatory Visit: Payer: Self-pay | Admitting: Genetic Counselor

## 2022-03-29 DIAGNOSIS — Z801 Family history of malignant neoplasm of trachea, bronchus and lung: Secondary | ICD-10-CM

## 2022-03-31 ENCOUNTER — Inpatient Hospital Stay: Payer: Medicaid Other

## 2022-03-31 ENCOUNTER — Telehealth: Payer: Self-pay

## 2022-03-31 ENCOUNTER — Inpatient Hospital Stay: Payer: Medicaid Other | Attending: Genetic Counselor | Admitting: Genetic Counselor

## 2022-03-31 NOTE — Telephone Encounter (Signed)
Patient did not keep his appointment for the genetics counseling today.  Called the patient. No answer. Left a message offering assistance rescheduling or if he has any questions about this referral.  Left me name and phone number for him to contact me at his convenience.

## 2022-05-05 NOTE — Telephone Encounter (Signed)
  Called the patient. No answer. Left a message offering assistance rescheduling or if he has any questions about this referral.  Left me name and phone number for him to contact me at his convenience.

## 2022-05-05 NOTE — Telephone Encounter (Signed)
Patient returning call. Please advise. Thank you.

## 2022-08-19 ENCOUNTER — Ambulatory Visit: Payer: Medicaid Other | Admitting: Family Medicine

## 2022-08-19 ENCOUNTER — Encounter: Payer: Self-pay | Admitting: Family Medicine

## 2022-08-19 VITALS — BP 126/76 | HR 79 | Temp 98.0°F | Ht 73.0 in | Wt 268.2 lb

## 2022-08-19 DIAGNOSIS — B028 Zoster with other complications: Secondary | ICD-10-CM

## 2022-08-19 MED ORDER — HYDROCODONE-ACETAMINOPHEN 5-325 MG PO TABS: 1.0000 | ORAL_TABLET | Freq: Four times a day (QID) | ORAL | 0 refills | Status: DC | PRN

## 2022-08-19 MED ORDER — VALACYCLOVIR HCL 1 G PO TABS: 1000.0000 mg | ORAL_TABLET | Freq: Three times a day (TID) | ORAL | 0 refills | Status: AC

## 2022-08-19 NOTE — Progress Notes (Signed)
Subjective:    Patient ID: Rick Kim, male    DOB: 07-11-1970, 52 y.o.   MRN: [id number]  Rash   Patient is a very pleasant 52 year old Caucasian gentleman who presents today with a burning stinging herpetiform rash over the left greater trochanter.  It has been there for 2 days.  He also complains of some pain in his lower gluteus area but there is no visible rash in that area. Past Medical History:  Diagnosis Date   Anxiety    Depression    Disorder of lumbar spine    GERD (gastroesophageal reflux disease)    diet related    Hyperlipidemia    Neuromuscular disorder    Smoker    Past Surgical History:  Procedure Laterality Date   CARPAL TUNNEL RELEASE Left 09/23/2016   Procedure: CARPAL TUNNEL RELEASE;  Surgeon: Ashok Pall, MD;  Location: [place];  Service: Neurosurgery;  Laterality: Left;  left   MULTIPLE TOOTH EXTRACTIONS  2016   under light sedation   WISDOM TOOTH EXTRACTION     age 52    Current Outpatient Medications on File Prior to Visit  Medication Sig Dispense Refill   atorvastatin (LIPITOR) 40 MG tablet Take 1 tablet (40 mg total) by mouth daily. 90 tablet 3   buPROPion (WELLBUTRIN SR) 150 MG 12 hr tablet Take 1 daily for 5 days then take 1 twice a day (Patient not taking: Reported on 12/29/2021) 60 tablet 5   omega-3 acid ethyl esters (LOVAZA) 1 g capsule Take 2 capsules (2 g total) by mouth 2 (two) times daily. 120 capsule 3   PEG-KCl-NaCl-NaSulf-Na Asc-C (PLENVU) 140 g SOLR Take 1 kit by mouth as directed. 1 each 0   zolpidem (AMBIEN) 10 MG tablet Take 1 tablet (10 mg total) by mouth at bedtime as needed. for sleep 30 tablet 4   Current Facility-Administered Medications on File Prior to Visit  Medication Dose Route Frequency Provider Last Rate Last Admin   0.9 %  sodium chloride infusion  500 mL Intravenous Once Nandigam, Venia Minks, MD       Allergies  Allergen Reactions   No Known Allergies    Social History   Socioeconomic History   Marital  status: Married    Spouse name: Not on file   Number of children: Not on file   Years of education: Not on file   Highest education level: Not on file  Occupational History   Not on file  Tobacco Use   Smoking status: Every Day    Packs/day: 1    Types: Cigarettes   Smokeless tobacco: Never  Vaping Use   Vaping Use: Never used  Substance and Sexual Activity   Alcohol use: Not Currently    Comment: socially   Drug use: No   Sexual activity: Yes  Other Topics Concern   Not on file  Social History Narrative   Entered 22:   Married.   4 kids -- Ages 53 y/o, 70 y/o, Twins 76 y/o   2 older kids from different marriage.    Twins from new marriage --this wife had never had kids until these twins.   Works on [employer].   He walks around neighborhood routinely and rides bike.         Social Determinants of Health   Financial Resource Strain: Not on file  Food Insecurity: Not on file  Transportation Needs: Not on file  Physical Activity: Not on file  Stress: Not on file  Social Connections: Not on file  Intimate Partner Violence: Not on file      Review of Systems  Skin:  Positive for rash.  All other systems reviewed and are negative.      Objective:   Physical Exam Vitals reviewed.  Constitutional:      Appearance: Normal appearance.  Eyes:   Cardiovascular:     Rate and Rhythm: Normal rate and regular rhythm.     Heart sounds: Normal heart sounds. No murmur heard.    No friction rub. No gallop.  Pulmonary:     Effort: Pulmonary effort is normal. No respiratory distress.     Breath sounds: Normal breath sounds. No wheezing or rales.  Musculoskeletal:     Right lower leg: No edema.     Left lower leg: No edema.  Neurological:     Mental Status: He is alert.           Assessment & Plan:  Herpes zoster with complication I believe this is most likely shingles.  Patient has no history of herpes and no exposure to herpes.  Treat the patient with Valtrex 1 g  p.o. 3 times daily for 7 days and use Norco as needed for pain

## 2023-02-18 ENCOUNTER — Other Ambulatory Visit: Payer: Self-pay | Admitting: Family Medicine

## 2023-02-18 NOTE — Telephone Encounter (Signed)
Requested medication (s) are due for refill today:   Yes  Requested medication (s) are on the active medication list:   Yes  Future visit scheduled:   Yes 02/25/2023     LOV 08/19/2022   Last ordered: 10/06/2021 #90, 3 refills  Returned because he is out of this medication.  Unable to refill because labs are due.   Provider to review for refills prior to his appt.  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   [facility] Pickard, Priscille Heidelberg, MD   2 years ago Colon cancer screening   [facility] Pickard, Priscille Heidelberg, MD   4 years ago Encounter for removal of sutures   [facility] Danelle Berry, PA-C   4 years ago Skin lesion of back   [facility] Danelle Berry, PA-C   5 years ago Cervical spine disease   [facility] Dorena Bodo, PA-C       Future Appointments             In 1 week Tanya Nones, Priscille Heidelberg, MD [facility]            Failed - Lipid Panel in normal range within the last 12 months    Cholesterol  Date Value Ref Range Status  01/11/2022 187 <200 mg/dL Final   LDL Cholesterol (Calc)  Date Value Ref Range Status  01/11/2022 122 (H) mg/dL (calc) Final    Comment:    Reference range: <100 . Desirable range <100 mg/dL for primary prevention;   <70 mg/dL for patients with CHD or diabetic patients  with > or = 2 CHD risk factors. Marland Kitchen LDL-C is now calculated using the Martin-Hopkins  calculation, which is a validated novel method providing  better accuracy than the Friedewald equation in the  estimation of LDL-C.  Horald Pollen et al. Lenox Ahr. 8756;433(29): 2061-2068  (http://education.[facility].com/faq/FAQ164)    HDL  Date Value Ref Range Status  01/11/2022 28 (L) > OR = 40 mg/dL Final   Triglycerides  Date Value Ref Range Status  01/11/2022 245 (H) <150 mg/dL Final    Comment:    . If a non-fasting specimen was collected, consider repeat triglyceride testing on a fasting specimen if clinically indicated.  Perry Mount et al. J. of Clin. Lipidol. 2015;9:129-169. Marland Kitchen          Passed - Patient is not pregnant

## 2023-02-18 NOTE — Telephone Encounter (Signed)
Pt called in to ask for courtesy refill of this med atorvastatin (LIPITOR) 40 MG. Pt is completely out of this med. Pt is scheduled for an OV/labs with pcp on 02/25/23. Pt will also schedule for his cpe at this visit. Please advise.   LOV: 08/19/22  PHARMACY: [id number] - [place], [place] - [address] AT [facility] [address], [place] [address] Phone: [phone]  Fax: [phone]  CB: [phone]

## 2023-02-25 ENCOUNTER — Ambulatory Visit: Payer: Medicaid Other | Admitting: Family Medicine

## 2023-02-25 ENCOUNTER — Encounter: Payer: Self-pay | Admitting: Family Medicine

## 2023-02-25 VITALS — BP 126/76 | HR 78 | Ht 73.0 in | Wt 260.0 lb

## 2023-02-25 DIAGNOSIS — E7800 Pure hypercholesterolemia, unspecified: Secondary | ICD-10-CM

## 2023-02-25 DIAGNOSIS — R7309 Other abnormal glucose: Secondary | ICD-10-CM | POA: Diagnosis not present

## 2023-02-25 DIAGNOSIS — M5431 Sciatica, right side: Secondary | ICD-10-CM | POA: Diagnosis not present

## 2023-02-25 MED ORDER — HYDROCODONE-ACETAMINOPHEN 5-325 MG PO TABS: 1.0000 | ORAL_TABLET | Freq: Four times a day (QID) | ORAL | 0 refills | Status: AC | PRN

## 2023-02-25 MED ORDER — PREDNISONE 20 MG PO TABS: ORAL_TABLET | ORAL | 0 refills | Status: AC

## 2023-02-25 NOTE — Progress Notes (Signed)
Subjective:    Patient ID: Rick Kim, male    DOB: 30-Jun-1970, 52 y.o.   MRN: [id number]  Back Pain   Patient presents today with right-sided back pain.  He reports nervelike pain radiating down his right leg.  It is intense.  The pain goes all the way into his foot.  The pain is associated with hyperesthesias and paresthesias.  Patient has been receiving epidural steroid injections through his neurosurgeons office for the last few years.  MRI report is listed below: IMPRESSION: 1. Symptomatic level appears to be L4-L5, where a small to moderate disc herniation into the right lateral recess appears new since 2014 and results in severe stenosis. Query right L5 radiculitis. 2. Underlying chronic disc and other degeneration at that level with moderate chronic spinal stenosis. Left lateral recess patency has improved compared to 2014. No significant foraminal stenosis. 3. Stable L5-S1 degeneration with mild to moderate bilateral L5 foraminal stenosis. MRIs from 2019.  Patient has an appointment to see his neurosurgeon next week.  He is also due for fasting lab work to monitor his cholesterol.  His blood pressure is excellent today at 126/76.  He has been taking his atorvastatin. Past Medical History:  Diagnosis Date   Anxiety    Depression    Disorder of lumbar spine    GERD (gastroesophageal reflux disease)    diet related    Hyperlipidemia    Neuromuscular disorder ([facility])    Smoker    Past Surgical History:  Procedure Laterality Date   CARPAL TUNNEL RELEASE Left 09/23/2016   Procedure: CARPAL TUNNEL RELEASE;  Surgeon: Coletta Memos, MD;  [facility];  Service: Neurosurgery;  Laterality: Left;  left   MULTIPLE TOOTH EXTRACTIONS  2016   under light sedation   WISDOM TOOTH EXTRACTION     age 44    Current Outpatient Medications on File Prior to Visit  Medication Sig Dispense Refill   atorvastatin (LIPITOR) 40 MG tablet Take 1 tablet (40 mg total) by mouth daily. 90 tablet  3   omega-3 acid ethyl esters (LOVAZA) 1 g capsule Take 2 capsules (2 g total) by mouth 2 (two) times daily. (Patient not taking: Reported on 02/25/2023) 120 capsule 3   PEG-KCl-NaCl-NaSulf-Na Asc-C (PLENVU) 140 g SOLR Take 1 kit by mouth as directed. (Patient not taking: Reported on 02/25/2023) 1 each 0   valACYclovir (VALTREX) 1000 MG tablet Take 1 tablet (1,000 mg total) by mouth 3 (three) times daily. (Patient not taking: Reported on 02/25/2023) 21 tablet 0   zolpidem (AMBIEN) 10 MG tablet Take 1 tablet (10 mg total) by mouth at bedtime as needed. for sleep (Patient not taking: Reported on 02/25/2023) 30 tablet 4   No current facility-administered medications on file prior to visit.   Allergies  Allergen Reactions   No Known Allergies    Social History   Socioeconomic History   Marital status: Married    Spouse name: Not on file   Number of children: Not on file   Years of education: Not on file   Highest education level: Not on file  Occupational History   Not on file  Tobacco Use   Smoking status: Every Day    Current packs/day: 1.00    Types: Cigarettes   Smokeless tobacco: Never  Vaping Use   Vaping status: Never Used  Substance and Sexual Activity   Alcohol use: Not Currently    Comment: socially   Drug use: No   Sexual activity: Yes  Other Topics Concern   Not on file  Social History Narrative   Entered 2015:   Married.   4 kids -- Ages 67 y/o, 74 y/o, Twins 32 y/o   2 older kids from different marriage.    Twins from new marriage --this wife had never had kids until these twins.   Works on [employer].   He walks around neighborhood routinely and rides bike.         Social Determinants of Health   Financial Resource Strain: Not on file  Food Insecurity: Not on file  Transportation Needs: Not on file  Physical Activity: Not on file  Stress: Not on file  Social Connections: Not on file  Intimate Partner Violence: Not on file      Review of Systems   Musculoskeletal:  Positive for back pain.  All other systems reviewed and are negative.      Objective:   Physical Exam Vitals reviewed.  Constitutional:      Appearance: Normal appearance.  Eyes:   Cardiovascular:     Rate and Rhythm: Normal rate and regular rhythm.     Heart sounds: Normal heart sounds. No murmur heard.    No friction rub. No gallop.  Pulmonary:     Effort: Pulmonary effort is normal. No respiratory distress.     Breath sounds: Normal breath sounds. No wheezing or rales.  Musculoskeletal:     Lumbar back: Tenderness present. Decreased range of motion.     Right lower leg: No edema.     Left lower leg: No edema.       Legs:  Neurological:     Mental Status: He is alert.           Assessment & Plan:  Pure hypercholesterolemia - Plan: COMPLETE METABOLIC PANEL WITH GFR, Lipid panel, CBC with Differential/Platelet  Elevated glucose - Plan: Hemoglobin A1c  Right sided sciatica Blood pressure today is excellent.  I will check a CMP and a lipid panel.  Goal LDL cholesterol is less than 100.  On the patient's last lab work his blood sugar was 119.  For that reason I will also check a hemoglobin A1c.  I believe the patient is dealing with right-sided sciatica.  I will give the patient a prednisone taper pack.  I also gave him prescription for Norco 5/325, 1 tablet every 8 hours as needed for pain.

## 2023-02-26 LAB — LIPID PANEL
Cholesterol: 201 mg/dL — ABNORMAL HIGH (ref <200)
HDL: 30 mg/dL — ABNORMAL LOW (ref >=40)
LDL Cholesterol (Calc): 135 mg/dL — ABNORMAL HIGH
Non-HDL Cholesterol (Calc): 171 mg/dL — ABNORMAL HIGH (ref <130)
Total CHOL/HDL Ratio: 6.7 (calc) — ABNORMAL HIGH (ref <5.0)
Triglycerides: 214 mg/dL — ABNORMAL HIGH (ref <150)

## 2023-02-26 LAB — COMPLETE METABOLIC PANEL WITH GFR
AG Ratio: 1.7 (calc) (ref 1.0–2.5)
ALT: 32 U/L (ref 9–46)
AST: 23 U/L (ref 10–35)
Albumin: 4.6 g/dL (ref 3.6–5.1)
Alkaline phosphatase (APISO): 91 U/L (ref 35–144)
BUN: 14 mg/dL (ref 7–25)
CO2: 23 mmol/L (ref 20–32)
Calcium: 9.7 mg/dL (ref 8.6–10.3)
Chloride: 106 mmol/L (ref 98–110)
Creat: 1.2 mg/dL (ref 0.70–1.30)
Globulin: 2.7 g/dL (ref 1.9–3.7)
Glucose, Bld: 130 mg/dL — ABNORMAL HIGH (ref 65–99)
Potassium: 4.3 mmol/L (ref 3.5–5.3)
Sodium: 138 mmol/L (ref 135–146)
Total Bilirubin: 0.7 mg/dL (ref 0.2–1.2)
Total Protein: 7.3 g/dL (ref 6.1–8.1)
eGFR: 72 mL/min/{1.73_m2} (ref >=60)

## 2023-02-26 LAB — CBC WITH DIFFERENTIAL/PLATELET
Absolute Monocytes: 585 {cells}/uL (ref 200–950)
Basophils Absolute: 71 {cells}/uL (ref 0–200)
Basophils Relative: 0.9 %
Eosinophils Absolute: 150 {cells}/uL (ref 15–500)
Eosinophils Relative: 1.9 %
HCT: 49.4 % (ref 38.5–50.0)
Hemoglobin: 16.6 g/dL (ref 13.2–17.1)
Lymphs Abs: 2228 {cells}/uL (ref 850–3900)
MCH: 31.6 pg (ref 27.0–33.0)
MCHC: 33.6 g/dL (ref 32.0–36.0)
MCV: 94.1 fL (ref 80.0–100.0)
MPV: 11.2 fL (ref 7.5–12.5)
Monocytes Relative: 7.4 %
Neutro Abs: 4866 {cells}/uL (ref 1500–7800)
Neutrophils Relative %: 61.6 %
Platelets: 252 10*3/uL (ref 140–400)
RBC: 5.25 10*6/uL (ref 4.20–5.80)
RDW: 12.5 % (ref 11.0–15.0)
Total Lymphocyte: 28.2 %
WBC: 7.9 10*3/uL (ref 3.8–10.8)

## 2023-02-26 LAB — HEMOGLOBIN A1C
Hgb A1c MFr Bld: 6.1 %{Hb} — ABNORMAL HIGH (ref <5.7)
Mean Plasma Glucose: 128 mg/dL
eAG (mmol/L): 7.1 mmol/L

## 2023-03-01 ENCOUNTER — Other Ambulatory Visit: Payer: Self-pay

## 2023-03-01 ENCOUNTER — Telehealth: Payer: Self-pay

## 2023-03-01 DIAGNOSIS — E7800 Pure hypercholesterolemia, unspecified: Secondary | ICD-10-CM

## 2023-03-01 MED ORDER — ATORVASTATIN CALCIUM 40 MG PO TABS: 40.0000 mg | ORAL_TABLET | Freq: Every day | ORAL | 1 refills | Status: AC

## 2023-03-01 NOTE — Telephone Encounter (Signed)
Prescription Request  03/01/2023  LOV: 02/25/23  What is the name of the medication or equipment? atorvastatin (LIPITOR) 40 MG tablet [[id number]]  Have you contacted your pharmacy to request a refill? Yes   Which pharmacy would you like this sent to?  [id number] - [place], [place] - [address] [place] [address] Phone: [phone] Fax: [phone]    Patient notified that their request is being sent to the clinical staff for review and that they should receive a response within 2 business days.   Please advise at [facility] [phone]

## 2023-03-02 ENCOUNTER — Other Ambulatory Visit: Payer: Self-pay

## 2023-03-02 MED ORDER — EZETIMIBE 10 MG PO TABS: 10.0000 mg | ORAL_TABLET | Freq: Every day | ORAL | 3 refills | Status: AC

## 2023-03-03 ENCOUNTER — Other Ambulatory Visit: Payer: Self-pay | Admitting: Orthopedic Surgery

## 2023-03-03 ENCOUNTER — Encounter: Payer: Self-pay | Admitting: Orthopedic Surgery

## 2023-03-03 DIAGNOSIS — M545 Low back pain, unspecified: Secondary | ICD-10-CM

## 2023-03-04 ENCOUNTER — Ambulatory Visit
Admission: RE | Admit: 2023-03-04 | Discharge: 2023-03-04 | Disposition: A | Discharge status: Home / Self Care | Payer: Medicaid Other | Source: Ambulatory Visit | Attending: Orthopedic Surgery | Admitting: Orthopedic Surgery

## 2023-03-04 ENCOUNTER — Other Ambulatory Visit: Payer: Self-pay | Admitting: Orthopedic Surgery

## 2023-03-04 DIAGNOSIS — M545 Low back pain, unspecified: Secondary | ICD-10-CM

## 2023-03-04 MED ORDER — METHYLPREDNISOLONE ACETATE 40 MG/ML INJ SUSP (RADIOLOG
80.0000 mg | Freq: Once | INTRAMUSCULAR | Status: AC
  Administered 2023-03-04: 80 mg via EPIDURAL

## 2023-03-04 MED ORDER — IOPAMIDOL (ISOVUE-M 200) INJECTION 41%
1.0000 mL | Freq: Once | INTRAMUSCULAR | Status: AC
  Administered 2023-03-04: 1 mL via EPIDURAL

## 2023-03-04 NOTE — Discharge Instructions (Signed)

## 2023-03-15 ENCOUNTER — Other Ambulatory Visit: Payer: Self-pay | Admitting: Orthopedic Surgery

## 2023-03-15 DIAGNOSIS — M545 Low back pain, unspecified: Secondary | ICD-10-CM

## 2023-03-17 ENCOUNTER — Encounter: Payer: Self-pay | Admitting: Orthopedic Surgery

## 2023-03-22 ENCOUNTER — Other Ambulatory Visit: Payer: Medicaid Other

## 2023-03-29 ENCOUNTER — Other Ambulatory Visit: Payer: Medicaid Other

## 2023-04-01 ENCOUNTER — Other Ambulatory Visit: Payer: Medicaid Other
